# Patient Record
Sex: Female | Born: 1945 | Race: White | Hispanic: No | Marital: Married | Smoking: Never smoker
Health system: Southern US, Community
[De-identification: ages and names within clinical notes are randomized; demographics above are authoritative.]

## PROBLEM LIST (undated history)

## (undated) DIAGNOSIS — I10 Essential (primary) hypertension: Secondary | ICD-10-CM

## (undated) DIAGNOSIS — G4700 Insomnia, unspecified: Secondary | ICD-10-CM

## (undated) DIAGNOSIS — E119 Type 2 diabetes mellitus without complications: Secondary | ICD-10-CM

## (undated) DIAGNOSIS — E785 Hyperlipidemia, unspecified: Secondary | ICD-10-CM

## (undated) DIAGNOSIS — M109 Gout, unspecified: Secondary | ICD-10-CM

## (undated) DIAGNOSIS — M25511 Pain in right shoulder: Secondary | ICD-10-CM

## (undated) DIAGNOSIS — G4730 Sleep apnea, unspecified: Secondary | ICD-10-CM

## (undated) HISTORY — PX: ABDOMINAL HYSTERECTOMY: SHX81

## (undated) HISTORY — DX: Pain in right shoulder: M25.511

## (undated) HISTORY — DX: Sleep apnea, unspecified: G47.30

## (undated) HISTORY — DX: Essential (primary) hypertension: I10

## (undated) HISTORY — DX: Type 2 diabetes mellitus without complications: E11.9

## (undated) HISTORY — DX: Hyperlipidemia, unspecified: E78.5

## (undated) HISTORY — DX: Gout, unspecified: M10.9

## (undated) HISTORY — DX: Insomnia, unspecified: G47.00

---
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
## 2008-12-10 ENCOUNTER — Ambulatory Visit: Payer: Self-pay | Admitting: Internal Medicine

## 2013-06-06 ENCOUNTER — Encounter: Payer: Self-pay | Admitting: Podiatry

## 2013-06-13 ENCOUNTER — Encounter: Payer: Self-pay | Admitting: Podiatry

## 2013-06-13 ENCOUNTER — Ambulatory Visit (INDEPENDENT_AMBULATORY_CARE_PROVIDER_SITE_OTHER): Payer: Medicare HMO | Admitting: Podiatry

## 2013-06-13 ENCOUNTER — Ambulatory Visit (INDEPENDENT_AMBULATORY_CARE_PROVIDER_SITE_OTHER): Payer: Medicare HMO

## 2013-06-13 VITALS — BP 124/50 | HR 86 | Resp 16 | Ht 62.0 in | Wt 270.0 lb

## 2013-06-13 DIAGNOSIS — M79609 Pain in unspecified limb: Secondary | ICD-10-CM

## 2013-06-13 DIAGNOSIS — M7750 Other enthesopathy of unspecified foot: Secondary | ICD-10-CM

## 2013-06-13 DIAGNOSIS — B351 Tinea unguium: Secondary | ICD-10-CM

## 2013-06-13 DIAGNOSIS — M79673 Pain in unspecified foot: Secondary | ICD-10-CM

## 2013-06-13 MED ORDER — TRIAMCINOLONE ACETONIDE 10 MG/ML IJ SUSP
10.0000 mg | Freq: Once | INTRAMUSCULAR | Status: AC
  Administered 2013-06-13: 10 mg

## 2013-06-13 NOTE — Progress Notes (Signed)
Subjective:     Patient ID: Priscilla HarpsErnestine Johnson, female   DOB: 03/17/1946, 68 y.o.   MRN: [id number]  Foot Pain   patient presents stating I have pain on top of my left foot and I have nails I cannot cut myself and they become painful with a long-term history of diabetes states under reasonably good control   Review of Systems  All other systems reviewed and are negative.       Objective:   Physical Exam  Nursing note and vitals reviewed. Constitutional: She is oriented to person, place, and time.  Cardiovascular: Intact distal pulses.   Musculoskeletal: Normal range of motion.  Neurological: She is oriented to person, place, and time.  Skin: Skin is dry.   neurovascular status intact with no change in health history and normal range of motion of the subtalar midtarsal joint with mild equinus condition noted. Discomfort on the dorsum of the left foot around the tarsal joints with inflammation also of the tendon complex and he long gaited nailbeds 1-5 both feet that are thick and discomforting when pressed     Assessment:     Long-term diabetic with tendinitis probable arthritis and painful nails 1-5 both feet    Plan:     Reviewed conditions and x-rays and today did careful injection dorsal 3 mg Kenalog 5 mg Xylocaine Marcaine mixture and debridement nailbeds 1-5 both feet with no iatrogenic bleeding noted

## 2013-06-13 NOTE — Progress Notes (Signed)
   Subjective:    Patient ID: Priscilla Johnson, female    DOB: 12-21-1945, 68 y.o.   MRN: [id number]  HPI Comments: N 0 L left foot pain in great toe and dorsal / trim toenails D off and on 1 yr O sudden C better A walking T ibuprofen, went to [facility] seen dr Elvin Sopetery and dr price said she had arthritis and gave her a walking boot    Foot Pain      Review of Systems  HENT:       Sinus problems  Musculoskeletal:       Joint pain  All other systems reviewed and are negative.       Objective:   Physical Exam        Assessment & Plan:

## 2013-09-12 ENCOUNTER — Telehealth: Payer: Self-pay | Admitting: *Deleted

## 2013-09-12 ENCOUNTER — Ambulatory Visit: Payer: Medicare HMO | Admitting: Podiatry

## 2013-09-12 NOTE — Telephone Encounter (Signed)
SPOKE WITH SARA AT [facility]. TOLD HER THAT PT WOULD NEED TO CALL OUR OFFICE AND LET us KNOW WHICH DOCTOR IS TREATING HER FOR DIABETES AND WE WOULD SEND PAPERWORK OVER TO THAT PHYSICIAN SO HE/SHE CAN APPROVE DIABETIC SHOES. SARA UNDERSTOOD.

## 2013-09-19 ENCOUNTER — Ambulatory Visit (INDEPENDENT_AMBULATORY_CARE_PROVIDER_SITE_OTHER): Payer: Medicare HMO | Admitting: Podiatry

## 2013-09-19 VITALS — BP 125/60 | HR 87 | Resp 16

## 2013-09-19 DIAGNOSIS — M79609 Pain in unspecified limb: Secondary | ICD-10-CM

## 2013-09-19 DIAGNOSIS — B351 Tinea unguium: Secondary | ICD-10-CM

## 2013-09-20 NOTE — Progress Notes (Signed)
Subjective:     Patient ID: Priscilla Johnson, female   DOB: 01/06/1946, 68 y.o.   MRN: [id number]  HPI patient presents stating my nails are bad I cannot secure them reach them or see them and they get thick and become painful   Review of Systems     Objective:   Physical Exam Neurovascular status intact with thick nail bed 1-5 both feet there yellow brittle and impossible to cut with pain    Assessment:     Mycotic nail infection with pain 1-5 both feet    Plan:     Debridement painful nail bed 1-5 both feet with no iatrogenic bleeding noted

## 2013-12-19 ENCOUNTER — Ambulatory Visit: Payer: Medicare HMO | Admitting: Podiatry

## 2013-12-26 ENCOUNTER — Ambulatory Visit (INDEPENDENT_AMBULATORY_CARE_PROVIDER_SITE_OTHER): Payer: Medicare HMO | Admitting: Podiatry

## 2013-12-26 VITALS — BP 140/58 | HR 80 | Resp 16

## 2013-12-26 DIAGNOSIS — M7750 Other enthesopathy of unspecified foot: Secondary | ICD-10-CM

## 2013-12-26 DIAGNOSIS — M79609 Pain in unspecified limb: Secondary | ICD-10-CM

## 2013-12-26 DIAGNOSIS — B351 Tinea unguium: Secondary | ICD-10-CM

## 2013-12-26 DIAGNOSIS — M79673 Pain in unspecified foot: Secondary | ICD-10-CM

## 2013-12-26 NOTE — Progress Notes (Signed)
Subjective:     Patient ID: Priscilla Johnson, female   DOB: 1946-04-07, 69 y.o.   MRN: [id number]  HPI patient presents with nail disease and thickness 1-5 both feet that she cannot take care of and pain on top of the left foot that has really gotten bad over the last couple weeks   Review of Systems     Objective:   Physical Exam Neurovascular status unchanged with pain on top of the left foot that sore when pressed and nail disease with thickness and brittle type yellow debris 1-5 of both feet    Assessment:     Tendinitis dorsal left with mycotic nail infection 1-5 of both feet    Plan:     Injected dorsal foot 3 mg Kenalog 5 mg Xylocaine and debris did nailbeds 1-5 both feet with no iatrogenic bleeding noted

## 2014-03-23 ENCOUNTER — Other Ambulatory Visit: Payer: Medicare HMO

## 2014-03-30 ENCOUNTER — Ambulatory Visit (INDEPENDENT_AMBULATORY_CARE_PROVIDER_SITE_OTHER): Payer: Medicare HMO | Admitting: Podiatry

## 2014-03-30 DIAGNOSIS — B351 Tinea unguium: Secondary | ICD-10-CM

## 2014-03-30 DIAGNOSIS — M79673 Pain in unspecified foot: Secondary | ICD-10-CM

## 2014-03-30 NOTE — Progress Notes (Signed)
Subjective:     Patient ID: Priscilla Johnson, female   DOB: [birth year], 68 y.o.   MRN: [id number]  HPI patient presents stating my nails are bad I cannot secure them reach them or see them and they get thick and become painful   Review of Systems     Objective:   Physical Exam Neurovascular status intact with thick nail bed 1-5 both feet there yellow brittle and impossible to cut with pain    Assessment:     Mycotic nail infection with pain 1-5 both feet    Plan:     Debridement painful nail bed 1-5 both feet with no iatrogenic bleeding noted

## 2014-05-17 ENCOUNTER — Ambulatory Visit (INDEPENDENT_AMBULATORY_CARE_PROVIDER_SITE_OTHER): Payer: Medicare Other | Admitting: Podiatry

## 2014-05-17 VITALS — BP 118/60 | HR 84 | Resp 16

## 2014-05-17 DIAGNOSIS — M19072 Primary osteoarthritis, left ankle and foot: Secondary | ICD-10-CM | POA: Diagnosis not present

## 2014-05-17 DIAGNOSIS — M79673 Pain in unspecified foot: Secondary | ICD-10-CM | POA: Diagnosis not present

## 2014-05-22 NOTE — Progress Notes (Signed)
Patient ID: Priscilla Johnson, female   DOB: Apr 03, 1946, 69 y.o.   MRN: [id number]  Subjective: 69 year old female returns the office they with complaints of continued discomfort on the top of her left foot. She states that after last appointment she did have some resolution of symptoms after the injection into the area. Over the last week or so she has started to notice reoccurrence of symptoms. She is requesting a steroid injection to the area at this time. She denies any recent swelling or redness overlying the area. Denies any recent injury or trauma. No other complaints at this time in no acute changes since last appointment. Denies any systemic complaints such as fevers, chills, nausea, vomiting.  Objective: AAO 3, NAD DP/PT pulses palpable, CRT less than 3 seconds Pprotective sensation appears to be intact with Dorann OuSimms Weinstein monofilament There is diffuse tenderness upon palpation along the dorsal aspect left midfoot. There is a prone exostosis identified which is not palpable in the contralateral extremity. There is no overlying edema, erythema, increase in warmth. No specific area pinpoint bony tenderness and there is no pain with vibratory sensation. Decrease in medial arch height upon weightbearing. No other areas of tenderness to bilateral lower extremities. MMT 5/5, ROM WNL No pain with calf compression, swelling, warmth, erythema. No open lesions or pre-ulcerative lesions identified.  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 -Treatment options were discussed with the patient including alternatives, risks, complications. -At this time the patient is requesting a steroid injection into the area. Risks and, occasions of the injection were discussed for which she understands and verbally consents to the injection. Under sterile conditions a total of 1.5 mL of a mixture of dexamethasone phosphate and 0.5% Marcaine  plain was infiltrated into the area of maximal tenderness along the dorsal aspect in the midfoot. A Band-Aid was applied. Patient tolerated the injection well without complications. Post injection care was discussed the patient. -Discussed shoe gear modifications and orthotics to help support her foot type and help take pressure off the midfoot. -Follow-up as scheduled. In the meantime, encouraged to call the office with any questions, concerns, change in symptoms.

## 2014-06-29 ENCOUNTER — Ambulatory Visit: Payer: Medicare Other | Admitting: Podiatry

## 2014-07-10 ENCOUNTER — Ambulatory Visit (INDEPENDENT_AMBULATORY_CARE_PROVIDER_SITE_OTHER): Payer: Medicare Other

## 2014-07-10 ENCOUNTER — Ambulatory Visit (INDEPENDENT_AMBULATORY_CARE_PROVIDER_SITE_OTHER): Payer: Medicare Other | Admitting: Podiatry

## 2014-07-10 VITALS — BP 131/59 | HR 81 | Resp 16

## 2014-07-10 DIAGNOSIS — M10071 Idiopathic gout, right ankle and foot: Secondary | ICD-10-CM

## 2014-07-10 DIAGNOSIS — M79609 Pain in unspecified limb: Secondary | ICD-10-CM | POA: Diagnosis not present

## 2014-07-10 DIAGNOSIS — M79671 Pain in right foot: Secondary | ICD-10-CM | POA: Diagnosis not present

## 2014-07-10 DIAGNOSIS — M1000 Idiopathic gout, unspecified site: Secondary | ICD-10-CM | POA: Diagnosis not present

## 2014-07-10 DIAGNOSIS — M109 Gout, unspecified: Secondary | ICD-10-CM

## 2014-07-11 ENCOUNTER — Other Ambulatory Visit: Payer: Self-pay | Admitting: Podiatry

## 2014-07-11 LAB — CBC WITH DIFFERENTIAL/PLATELET
BASOS: 0 %
Basophils Absolute: 0 10*3/uL (ref 0.0–0.2)
EOS ABS: 0 10*3/uL (ref 0.0–0.4)
Eos: 0 %
HEMATOCRIT: 34.6 % (ref 34.0–46.6)
HEMOGLOBIN: 10.7 g/dL — AB (ref 11.1–15.9)
Immature Grans (Abs): 0 10*3/uL (ref 0.0–0.1)
Immature Granulocytes: 0 %
Lymphocytes Absolute: 1.5 10*3/uL (ref 0.7–3.1)
Lymphs: 16 %
MCH: 26.8 pg (ref 26.6–33.0)
MCHC: 30.9 g/dL — AB (ref 31.5–35.7)
MCV: 87 fL (ref 79–97)
MONOS ABS: 0.8 10*3/uL (ref 0.1–0.9)
Monocytes: 8 %
Neutrophils Absolute: 7.1 10*3/uL — ABNORMAL HIGH (ref 1.4–7.0)
Neutrophils Relative %: 76 %
Platelets: 302 10*3/uL (ref 150–379)
RBC: 4 x10E6/uL (ref 3.77–5.28)
RDW: 15.6 % — AB (ref 12.3–15.4)
WBC: 9.6 10*3/uL (ref 3.4–10.8)

## 2014-07-11 LAB — C-REACTIVE PROTEIN: CRP: 127 mg/L — AB (ref 0.0–4.9)

## 2014-07-11 LAB — URIC ACID: Uric Acid: 10.4 mg/dL — ABNORMAL HIGH (ref 2.5–7.1)

## 2014-07-11 LAB — SEDIMENTATION RATE: SED RATE: 42 mm/h — AB (ref 0–40)

## 2014-07-11 MED ORDER — METHYLPREDNISOLONE (PAK) 4 MG PO TABS: ORAL_TABLET | ORAL | Status: DC

## 2014-07-11 NOTE — Progress Notes (Signed)
Patient ID: Versa Craton, female   DOB: [birth year], 69 y.o.   MRN: [id number]  Subjective: 69 year old female returns the office today with complaints of right big toe pain, swelling, redness which started on Friday. She states that over the last couple days she has noticed increasing pain and swelling. She denies any history of injury or trauma to the area. She denies any recent changes in her diet and she denies ever having a history of gout. She states that she has pain when trying to bend the toe and upon prolonged weightbearing. She states that her left foot is doing well and had no problems since the injection. She denies any systemic complaints as fevers, chills, nausea, vomiting. No other complaints at this time.  Objective: AAO x3, NAD DP/PT pulses palpable bilaterally, CRT less than 3 seconds Protective sensation intact with Simms Weinstein monofilament, vibratory sensation intact, Achilles tendon reflex intact Tenderness palpation overlying the right first MTPJ mostly plantarly along the sesamoids. There is no tenderness directly upon the medial and lateral sesamoid. There is mild discomfort with first MPJ range of motion. There is mild overlying edema, erythema without much associated warmth. There is no ascending cellulitis. There is no areas of fluctuance or crepitus. No open lesions. There is no specific tenderness overlying the first metatarsal hallux. No other areas of tenderness to bilateral lower extremities. MMT 5/5, ROM WNL.  No open lesions or pre-ulcerative lesions.  No overlying edema, erythema, increase in warmth to bilateral lower extremities.  No pain with calf compression, swelling, warmth, erythema bilaterally.  Assessment: 69 year old female right first MPJ pain; possible gout/sesamoiditis.  Plan: -X-rays were obtained and reviewed with the patient. -Treatment options were discussed including alternatives, risks, complications. -At this time discussed possible  etiologies of the pain with the patient. -Discussed steroid injection into the area which can help both gout or sesamoiditis. Patient would like to proceed with this after understanding risks complications and verbally consented. Under sterile conditions a total of 1.5 mL of a mixture of dexamethasone phosphate and 0.5% Marcaine plain was infiltrated around the sesamoids and the first MPJ without any complications. Post injection care was discussed the patient. -Offloading pads were dispensed to the patient. -Ordered CBC, ESR, CRP, uric acid -Follow-up in 2 weeks or sooner if any problems are to arise. In the meantime encouraged to call the office with any questions, concerns, change in symptoms.  *On March 23 of received the results of the blood work which revealed an elevated uric acid. I prescribed a Medrol Dosepak.

## 2014-07-16 DIAGNOSIS — E1129 Type 2 diabetes mellitus with other diabetic kidney complication: Secondary | ICD-10-CM | POA: Diagnosis not present

## 2014-07-16 DIAGNOSIS — I10 Essential (primary) hypertension: Secondary | ICD-10-CM | POA: Diagnosis not present

## 2014-07-16 DIAGNOSIS — I129 Hypertensive chronic kidney disease with stage 1 through stage 4 chronic kidney disease, or unspecified chronic kidney disease: Secondary | ICD-10-CM | POA: Diagnosis not present

## 2014-07-16 DIAGNOSIS — E785 Hyperlipidemia, unspecified: Secondary | ICD-10-CM | POA: Diagnosis not present

## 2014-07-20 ENCOUNTER — Ambulatory Visit: Payer: Medicare Other

## 2014-07-24 ENCOUNTER — Ambulatory Visit (INDEPENDENT_AMBULATORY_CARE_PROVIDER_SITE_OTHER): Payer: Medicare Other | Admitting: Podiatry

## 2014-07-24 ENCOUNTER — Encounter: Payer: Self-pay | Admitting: Podiatry

## 2014-07-24 VITALS — BP 118/53 | HR 76 | Resp 18 | Ht 63.0 in | Wt 275.0 lb

## 2014-07-24 DIAGNOSIS — M109 Gout, unspecified: Secondary | ICD-10-CM

## 2014-07-24 DIAGNOSIS — M10071 Idiopathic gout, right ankle and foot: Secondary | ICD-10-CM

## 2014-07-24 NOTE — Patient Instructions (Signed)
Gout Gout is an inflammatory arthritis caused by a buildup of uric acid crystals in the joints. Uric acid is a chemical that is normally present in the blood. When the level of uric acid in the blood is too high it can form crystals that deposit in your joints and tissues. This causes joint redness, soreness, and swelling (inflammation). Repeat attacks are common. Over time, uric acid crystals can form into masses (tophi) near a joint, destroying bone and causing disfigurement. Gout is treatable and often preventable. CAUSES  The disease begins with elevated levels of uric acid in the blood. Uric acid is produced by your body when it breaks down a naturally found substance called purines. Certain foods you eat, such as meats and fish, contain high amounts of purines. Causes of an elevated uric acid level include:  Being passed down from parent to child (heredity).  Diseases that cause increased uric acid production (such as obesity, psoriasis, and certain cancers).  Excessive alcohol use.  Diet, especially diets rich in meat and seafood.  Medicines, including certain cancer-fighting medicines (chemotherapy), water pills (diuretics), and aspirin.  Chronic kidney disease. The kidneys are no longer able to remove uric acid well.  Problems with metabolism. Conditions strongly associated with gout include:  Obesity.  High blood pressure.  High cholesterol.  Diabetes. Not everyone with elevated uric acid levels gets gout. It is not understood why some people get gout and others do not. Surgery, joint injury, and eating too much of certain foods are some of the factors that can lead to gout attacks. SYMPTOMS   An attack of gout comes on quickly. It causes intense pain with redness, swelling, and warmth in a joint.  Fever can occur.  Often, only one joint is involved. Certain joints are more commonly involved:  Base of the big toe.  Knee.  Ankle.  Wrist.  Finger. Without  treatment, an attack usually goes away in a few days to weeks. Between attacks, you usually will not have symptoms, which is different from many other forms of arthritis. DIAGNOSIS  Your caregiver will suspect gout based on your symptoms and exam. In some cases, tests may be recommended. The tests may include:  Blood tests.  Urine tests.  X-rays.  Joint fluid exam. This exam requires a needle to remove fluid from the joint (arthrocentesis). Using a microscope, gout is confirmed when uric acid crystals are seen in the joint fluid. TREATMENT  There are two phases to gout treatment: treating the sudden onset (acute) attack and preventing attacks (prophylaxis).  Treatment of an Acute Attack.  Medicines are used. These include anti-inflammatory medicines or steroid medicines.  An injection of steroid medicine into the affected joint is sometimes necessary.  The painful joint is rested. Movement can worsen the arthritis.  You may use warm or cold treatments on painful joints, depending which works best for you.  Treatment to Prevent Attacks.  If you suffer from frequent gout attacks, your caregiver may advise preventive medicine. These medicines are started after the acute attack subsides. These medicines either help your kidneys eliminate uric acid from your body or decrease your uric acid production. You may need to stay on these medicines for a very long time.  The early phase of treatment with preventive medicine can be associated with an increase in acute gout attacks. For this reason, during the first few months of treatment, your caregiver may also advise you to take medicines usually used for acute gout treatment. Be sure you   understand your caregiver's directions. Your caregiver may make several adjustments to your medicine dose before these medicines are effective.  Discuss dietary treatment with your caregiver or dietitian. Alcohol and drinks high in sugar and fructose and foods  such as meat, poultry, and seafood can increase uric acid levels. Your caregiver or dietitian can advise you on drinks and foods that should be limited. HOME CARE INSTRUCTIONS   Do not take aspirin to relieve pain. This raises uric acid levels.  Only take over-the-counter or prescription medicines for pain, discomfort, or fever as directed by your caregiver.  Rest the joint as much as possible. When in bed, keep sheets and blankets off painful areas.  Keep the affected joint raised (elevated).  Apply warm or cold treatments to painful joints. Use of warm or cold treatments depends on which works best for you.  Use crutches if the painful joint is in your leg.  Drink enough fluids to keep your urine clear or pale yellow. This helps your body get rid of uric acid. Limit alcohol, sugary drinks, and fructose drinks.  Follow your dietary instructions. Pay careful attention to the amount of protein you eat. Your daily diet should emphasize fruits, vegetables, whole grains, and fat-free or low-fat milk products. Discuss the use of coffee, vitamin C, and cherries with your caregiver or dietitian. These may be helpful in lowering uric acid levels.  Maintain a healthy body weight. SEEK MEDICAL CARE IF:   You develop diarrhea, vomiting, or any side effects from medicines.  You do not feel better in 24 hours, or you are getting worse. SEEK IMMEDIATE MEDICAL CARE IF:   Your joint becomes suddenly more tender, and you have chills or a fever. MAKE SURE YOU:   Understand these instructions.  Will watch your condition.  Will get help right away if you are not doing well or get worse. Document Released: 04/03/2000 Document Revised: 08/21/2013 Document Reviewed: 11/18/2011 [facility] Patient Information 2015 [employer], [place]. This information is not intended to replace advice given to you by your health care provider. Make sure you discuss any questions you have with your health care provider.  n you  eat. Include fruits, vegetables, whole grains, and fat-free or low-fat milk products in your daily diet. Talk to your doctor or dietitian about the use of coffee, vitamin C, and cherries. These may help lower uric acid levels.  Keep a healthy body weight. GET HELP RIGHT AWAY IF:   You have watery poop (diarrhea), throw up (vomit), or have any side effects from medicines.  You do not feel better in 24 hours, or you are getting worse.  Your joint becomes suddenly more tender, and you have chills or a fever. MAKE SURE YOU:   Understand these instructions.  Will watch your condition.  Will get help right away if you are not doing well or get worse. Document Released: 01/14/2008 Document Revised: 08/21/2013 Document Reviewed: 11/18/2011 [facility]Exit[facility] Patient Information 2015 [place], [place]. This information is not intended to replace advice given to you by your health care provider. Make sure you discuss any questions you have with your health care provider.

## 2014-07-25 NOTE — Progress Notes (Signed)
Patient ID: Priscilla Johnson, female   DOB: Dec 05, 1945, 69 y.o.   MRN: [id number]  Subjective: 69 year old female returns the office they for follow-up evaluation of right first metatarsal-phalangeal joint pain. She states that since starting the Medrol Dosepak for which she finish she's had resolution of symptoms and she no longer has any discomfort, swelling, redness. She stated that she is doing well this time. No other complaints at this time no acute changes.  Objective: AAO 3, NAD DP/PT pulses palpable, CRT less than 3 seconds  Protective sensation intact with Simms Weinstein monofilament, vibratory sensation intact, Achilles tendon reflex intact. Currently, there is no tenderness to palpation overlying the right first metatarsal phalangeal joint or along the sesamoids. There is no areas of pinpoint bony tenderness or pain with vibratory sensation of bilateral lower extremity. There is no upon edema, erythema, increased warmth bilaterally. MMT 5/5, ROM WNL No open lesions or pre-ulcer lesions identified bilaterally. No pain with calf compression, swelling, warmth, erythema.  Assessment: 69 year old female with resolved gout right foot  Plan: -I discussed the patient likely etiology for symptoms and gout. I discussed the patient had a regular diet to help prevent recurrence of gout attacks. Also discussed that if this is a continual problem would likely benefit from long-term therapy. For now continue to monitor. Follow-up as needed.

## 2014-07-27 ENCOUNTER — Ambulatory Visit: Payer: Medicare Other

## 2014-08-17 ENCOUNTER — Ambulatory Visit: Payer: Self-pay

## 2014-08-21 ENCOUNTER — Ambulatory Visit: Payer: Self-pay

## 2014-08-28 ENCOUNTER — Ambulatory Visit (INDEPENDENT_AMBULATORY_CARE_PROVIDER_SITE_OTHER): Payer: Medicare Other | Admitting: Podiatry

## 2014-08-28 DIAGNOSIS — M79673 Pain in unspecified foot: Secondary | ICD-10-CM

## 2014-08-28 DIAGNOSIS — B351 Tinea unguium: Secondary | ICD-10-CM | POA: Diagnosis not present

## 2014-08-28 NOTE — Progress Notes (Signed)
  Subjective: 69 y.o.-year-old female returns the office today for painful, elongated, thickened toenails. Denies any redness or drainage around the nails. She states the gout symptoms have resolved and she has nad no reoccurrence. Denies any acute changes since last appointment and no new complaints today. Denies any systemic complaints such as fevers, chills, nausea, vomiting.   Objective: AAO 3, NAD DP/PT pulses palpable, CRT less than 3 seconds Protective sensation intact with Simms Weinstein monofilament, Achilles tendon reflex intact.  Nails hypertrophic, dystrophic, elongated, brittle, discolored 10. There is tenderness overlying these nails. There is no surrounding erythema or drainage along the nail sites. No open lesions or pre-ulcerative lesions are identified. No other areas of tenderness bilateral lower extremities. No overlying edema, erythema, increased warmth. No pain with calf compression, swelling, warmth, erythema.  Assessment: Patient presents with symptomatic onychomycosis  Plan: -Treatment options including alternatives, risks, complications were discussed -Nails sharply debrided 10 without complication/bleeding. -Discussed daily foot inspection. If there are any changes, to call the office immediately.  -Follow-up in 3 months or sooner if any problems are to arise. In the meantime, encouraged to call the office with any questions, concerns, changes symptoms.

## 2014-10-13 ENCOUNTER — Other Ambulatory Visit: Payer: Self-pay | Admitting: Unknown Physician Specialty

## 2014-11-28 ENCOUNTER — Ambulatory Visit: Payer: Medicare Other

## 2014-12-11 ENCOUNTER — Ambulatory Visit (INDEPENDENT_AMBULATORY_CARE_PROVIDER_SITE_OTHER): Payer: Medicare Other | Admitting: Podiatry

## 2014-12-11 DIAGNOSIS — M79673 Pain in unspecified foot: Secondary | ICD-10-CM | POA: Diagnosis not present

## 2014-12-11 DIAGNOSIS — B351 Tinea unguium: Secondary | ICD-10-CM

## 2014-12-11 NOTE — Progress Notes (Signed)
  Subjective: 69 y.o.-year-old female returns the office today for painful, elongated, thickened toenails. Denies any redness or drainage around the nails. Denies any acute changes since last appointment and no new complaints today. Denies any systemic complaints such as fevers, chills, nausea, vomiting.  She tested that she's had a few gout flare since last appointment however she did not see a physician for this. Currently denies any symptoms.   Objective: AAO 3, NAD DP/PT pulses palpable, CRT less than 3 seconds Nails hypertrophic, dystrophic, elongated, brittle, discolored 10. There is tenderness overlying the nails 1-5 bilaterally. There is no surrounding erythema or drainage along the nail sites. No open lesions or pre-ulcerative lesions are identified. No other areas of tenderness bilateral lower extremities. No overlying edema, erythema, increased warmth. No pain with calf compression, swelling, warmth, erythema.  Assessment: Patient presents with symptomatic onychomycosis  Plan: -Treatment options including alternatives, risks, complications were discussed -Nails sharply debrided 10 without complication/bleeding. -Discussed daily foot inspection. If there are any changes, to call the office immediately.  -Monitor for gout flares. Discussed she would likely benefit from long term treatment. She would like to hold off for now. -Follow-up in 3 months or sooner if any problems are to arise. In the meantime, encouraged to call the office with any questions, concerns, changes symptoms.   Ovid Curd, DPM

## 2015-01-16 ENCOUNTER — Telehealth: Payer: Self-pay | Admitting: Unknown Physician Specialty

## 2015-01-16 ENCOUNTER — Ambulatory Visit (INDEPENDENT_AMBULATORY_CARE_PROVIDER_SITE_OTHER): Payer: Medicare Other | Admitting: Unknown Physician Specialty

## 2015-01-16 ENCOUNTER — Encounter: Payer: Self-pay | Admitting: Unknown Physician Specialty

## 2015-01-16 VITALS — BP 140/78 | HR 74 | Temp 98.3°F | Ht 62.5 in | Wt 280.4 lb

## 2015-01-16 DIAGNOSIS — Z23 Encounter for immunization: Secondary | ICD-10-CM

## 2015-01-16 DIAGNOSIS — E785 Hyperlipidemia, unspecified: Secondary | ICD-10-CM | POA: Diagnosis not present

## 2015-01-16 DIAGNOSIS — N183 Chronic kidney disease, stage 3 unspecified: Secondary | ICD-10-CM

## 2015-01-16 DIAGNOSIS — N185 Chronic kidney disease, stage 5: Secondary | ICD-10-CM | POA: Diagnosis not present

## 2015-01-16 DIAGNOSIS — I129 Hypertensive chronic kidney disease with stage 1 through stage 4 chronic kidney disease, or unspecified chronic kidney disease: Secondary | ICD-10-CM | POA: Diagnosis not present

## 2015-01-16 DIAGNOSIS — N189 Chronic kidney disease, unspecified: Secondary | ICD-10-CM | POA: Diagnosis not present

## 2015-01-16 DIAGNOSIS — E119 Type 2 diabetes mellitus without complications: Secondary | ICD-10-CM | POA: Diagnosis not present

## 2015-01-16 DIAGNOSIS — N184 Chronic kidney disease, stage 4 (severe): Secondary | ICD-10-CM

## 2015-01-16 DIAGNOSIS — N182 Chronic kidney disease, stage 2 (mild): Secondary | ICD-10-CM

## 2015-01-16 DIAGNOSIS — N181 Chronic kidney disease, stage 1: Secondary | ICD-10-CM

## 2015-01-16 DIAGNOSIS — E1122 Type 2 diabetes mellitus with diabetic chronic kidney disease: Secondary | ICD-10-CM | POA: Diagnosis not present

## 2015-01-16 DIAGNOSIS — G4700 Insomnia, unspecified: Secondary | ICD-10-CM

## 2015-01-16 DIAGNOSIS — E1129 Type 2 diabetes mellitus with other diabetic kidney complication: Secondary | ICD-10-CM | POA: Insufficient documentation

## 2015-01-16 DIAGNOSIS — G4730 Sleep apnea, unspecified: Secondary | ICD-10-CM | POA: Insufficient documentation

## 2015-01-16 LAB — LIPID PANEL PICCOLO, WAIVED
CHOL/HDL RATIO PICCOLO,WAIVE: 2.9 mg/dL
Cholesterol Piccolo, Waived: 152 mg/dL (ref <200)
HDL Chol Piccolo, Waived: 52 mg/dL — ABNORMAL LOW (ref >59)
LDL CHOL CALC PICCOLO WAIVED: 75 mg/dL (ref <100)
TRIGLYCERIDES PICCOLO,WAIVED: 124 mg/dL (ref <150)
VLDL CHOL CALC PICCOLO,WAIVE: 25 mg/dL (ref <30)

## 2015-01-16 LAB — BAYER DCA HB A1C WAIVED: HB A1C: 6.3 % (ref <7.0)

## 2015-01-16 MED ORDER — LISINOPRIL-HYDROCHLOROTHIAZIDE 20-25 MG PO TABS: 1.0000 | ORAL_TABLET | Freq: Every day | ORAL | Status: DC

## 2015-01-16 MED ORDER — PIOGLITAZONE HCL 30 MG PO TABS: 30.0000 mg | ORAL_TABLET | Freq: Every day | ORAL | Status: DC

## 2015-01-16 MED ORDER — ATORVASTATIN CALCIUM 40 MG PO TABS: 40.0000 mg | ORAL_TABLET | Freq: Every day | ORAL | Status: DC

## 2015-01-16 MED ORDER — LIRAGLUTIDE 18 MG/3ML ~~LOC~~ SOPN: 1.8000 mL | PEN_INJECTOR | Freq: Every day | SUBCUTANEOUS | Status: DC

## 2015-01-16 MED ORDER — AMLODIPINE BESYLATE 10 MG PO TABS
10.0000 mg | ORAL_TABLET | Freq: Every day | ORAL | Status: DC
Start: 2015-01-16 — End: 2015-07-15

## 2015-01-16 MED ORDER — GLIPIZIDE 5 MG PO TABS: 5.0000 mg | ORAL_TABLET | Freq: Two times a day (BID) | ORAL | Status: DC

## 2015-01-16 MED ORDER — METFORMIN HCL 500 MG PO TABS: 500.0000 mg | ORAL_TABLET | Freq: Two times a day (BID) | ORAL | Status: DC

## 2015-01-16 MED ORDER — ATENOLOL 100 MG PO TABS: 100.0000 mg | ORAL_TABLET | Freq: Every day | ORAL | Status: DC

## 2015-01-16 NOTE — Telephone Encounter (Signed)
Disregard

## 2015-01-16 NOTE — Progress Notes (Signed)
BP 140/78 mmHg  Pulse 74  Temp(Src) 98.3 F (36.8 C)  Ht 5' 2.5" (1.588 m)  Wt 280 lb 6.4 oz (127.189 kg)  BMI 50.44 kg/m2  SpO2 92%  LMP  (LMP Unknown)   Subjective:    Patient ID: Priscilla Johnson, female    DOB: 1946/03/31, 69 y.o.   MRN: [id number]  HPI: Priscilla Johnson is a 69 y.o. female  Chief Complaint  Patient presents with  . Diabetes  . Hyperlipidemia  . Hypertension   1.  DIABETES Hypoglycemic episodes:  no  H8  Polydipsia/polyuria:  no  H8 Visual disturbance:  no  R2  Chest pain:  no  R4  Paresthesias:  no  R10  Glucose Monitoring:  yes   Accucheck frequency:  occasional Fasting glucose:  113 just recently  HGB A1C: 6.4 6 months ago D1  GLUCOSE: 151 Foot Exam: Foot Exam Done on 01/24/13 P1 Aspirin:  Aspirin Therapy Done on 10/02/13 (Taking aspirin  )   2.  HYPERTENSION / HYPERLIPIDEMIA Patient is requesting refill(s). Satisfied with current treatment?  yes  H6  BP monitoring frequency:  not checking.  H5  BP medication side effects:  no P1 Cholesterol medication side effects:  no P1  Aspirin:  Aspirin Therapy Done on 10/02/13 (Taking aspirin  ) Recent stressors:  no  H6   Recurrent headaches:  no  R10 Visual changes:  no  R2  Palpitations:  no  R4  Dyspnea:  no  R5  Chest pain:  no  R4  Lower extremity edema:  no  R4 R foot, possible gout and arthritis in both feet Dizzy/lightheaded:  no  R10   3. Gout Went to the foot doctor who diagnosed her with gout.  She started no medications at this time.    Relevant past medical, surgical, family and social history reviewed and updated as indicated. Interim medical history since our last visit reviewed. Allergies and medications reviewed and updated.  Review of Systems  Constitutional: Negative.   HENT: Negative.   Eyes: Negative.   Respiratory: Negative.   Cardiovascular: Negative.   Gastrointestinal: Negative.   Endocrine: Negative.   Genitourinary: Negative.   Musculoskeletal:  Negative.   Skin: Negative.   Allergic/Immunologic: Negative.   Neurological: Negative.   Hematological: Negative.   Psychiatric/Behavioral: Negative.     Per HPI unless specifically indicated above     Objective:    BP 140/78 mmHg  Pulse 74  Temp(Src) 98.3 F (36.8 C)  Ht 5' 2.5" (1.588 m)  Wt 280 lb 6.4 oz (127.189 kg)  BMI 50.44 kg/m2  SpO2 92%  LMP  (LMP Unknown)  Wt Readings from Last 3 Encounters:  01/16/15 280 lb 6.4 oz (127.189 kg)  07/16/14 274 lb (124.286 kg)  07/24/14 275 lb (124.739 kg)    Physical Exam  Constitutional: She is oriented to person, place, and time. She appears well-developed and well-nourished. No distress.  HENT:  Head: Normocephalic and atraumatic.  Eyes: Conjunctivae and lids are normal. Right eye exhibits no discharge. Left eye exhibits no discharge. No scleral icterus.  Cardiovascular: Normal rate, regular rhythm and normal heart sounds.   Pulmonary/Chest: Effort normal and breath sounds normal. No respiratory distress.  Abdominal: Normal appearance and bowel sounds are normal. There is no splenomegaly or hepatomegaly.  Musculoskeletal: Normal range of motion.  Neurological: She is alert and oriented to person, place, and time.  Skin: Skin is intact. No rash noted. No pallor.  Psychiatric: She has a normal  mood and affect. Her behavior is normal. Judgment and thought content normal.   .     Assessment & Plan:   Problem List Items Addressed This Visit      Unprioritized   Type II diabetes mellitus with renal manifestations    Hgb A1C is 6.3.  Continue present meds      Relevant Medications   metFORMIN (GLUCOPHAGE) 500 MG tablet   lisinopril-hydrochlorothiazide (PRINZIDE,ZESTORETIC) 20-25 MG tablet   Liraglutide (VICTOZA) 18 MG/3ML SOPN   glipiZIDE (GLUCOTROL) 5 MG tablet   atorvastatin (LIPITOR) 40 MG tablet   pioglitazone (ACTOS) 30 MG tablet   Other Relevant Orders   Bayer DCA Hb A1c Waived   Hypertensive CKD (chronic  kidney disease)    Stable, continue present medications.       CKD (chronic kidney disease), stage III   Relevant Orders   Comprehensive metabolic panel   Hyperlipidemia    Lipid panel reviewed.  Stable, continue present meds      Relevant Medications   lisinopril-hydrochlorothiazide (PRINZIDE,ZESTORETIC) 20-25 MG tablet   atorvastatin (LIPITOR) 40 MG tablet   atenolol (TENORMIN) 100 MG tablet   amLODipine (NORVASC) 10 MG tablet   Other Relevant Orders   Lipid Panel Piccolo, Waived    Other Visit Diagnoses    Immunization due    -  Primary    Relevant Orders    Flu Vaccine QUAD 36+ mos PF IM (Fluarix & Fluzone Quad PF) (Completed)        Follow up plan: Return for physical.

## 2015-01-16 NOTE — Assessment & Plan Note (Signed)
Lipid panel reviewed.  Stable, continue present meds

## 2015-01-16 NOTE — Assessment & Plan Note (Signed)
Stable, continue present medications.   

## 2015-01-16 NOTE — Assessment & Plan Note (Signed)
Hgb A1C is 6.3.  Continue present meds 

## 2015-01-17 LAB — COMPREHENSIVE METABOLIC PANEL
ALBUMIN: 3.5 g/dL (ref 3.5–4.8)
ALK PHOS: 120 IU/L — AB (ref 39–117)
ALT: 8 IU/L (ref 0–32)
AST: 11 IU/L (ref 0–40)
Albumin/Globulin Ratio: 1.5 (ref 1.1–2.5)
BUN / CREAT RATIO: 20 (ref 11–26)
BUN: 18 mg/dL (ref 8–27)
Bilirubin Total: 0.3 mg/dL (ref 0.0–1.2)
CO2: 25 mmol/L (ref 18–29)
CREATININE: 0.92 mg/dL (ref 0.57–1.00)
Calcium: 9.1 mg/dL (ref 8.7–10.3)
Chloride: 97 mmol/L (ref 97–108)
GFR, EST AFRICAN AMERICAN: 73 mL/min/{1.73_m2} (ref >59)
GFR, EST NON AFRICAN AMERICAN: 63 mL/min/{1.73_m2} (ref >59)
GLOBULIN, TOTAL: 2.4 g/dL (ref 1.5–4.5)
GLUCOSE: 81 mg/dL (ref 65–99)
Potassium: 4.2 mmol/L (ref 3.5–5.2)
SODIUM: 139 mmol/L (ref 134–144)
TOTAL PROTEIN: 5.9 g/dL — AB (ref 6.0–8.5)

## 2015-03-12 ENCOUNTER — Ambulatory Visit (INDEPENDENT_AMBULATORY_CARE_PROVIDER_SITE_OTHER): Payer: Medicare Other | Admitting: Sports Medicine

## 2015-03-12 ENCOUNTER — Ambulatory Visit: Payer: Medicare Other

## 2015-03-12 ENCOUNTER — Encounter: Payer: Self-pay | Admitting: Sports Medicine

## 2015-03-12 DIAGNOSIS — L309 Dermatitis, unspecified: Secondary | ICD-10-CM | POA: Diagnosis not present

## 2015-03-12 DIAGNOSIS — M79673 Pain in unspecified foot: Secondary | ICD-10-CM | POA: Diagnosis not present

## 2015-03-12 DIAGNOSIS — E119 Type 2 diabetes mellitus without complications: Secondary | ICD-10-CM

## 2015-03-12 DIAGNOSIS — B351 Tinea unguium: Secondary | ICD-10-CM

## 2015-03-12 MED ORDER — CLOTRIMAZOLE-BETAMETHASONE 1-0.05 % EX LOTN: TOPICAL_LOTION | Freq: Two times a day (BID) | CUTANEOUS | Status: DC

## 2015-03-12 NOTE — Progress Notes (Signed)
Patient ID: Priscilla Johnson, female   DOB: [birth year], 69 y.o.   MRN: [id number] Subjective: Priscilla Johnson is a 69 y.o. female patient with history of type 2 diabetes who presents to office today complaining of long, painful nails  while ambulating in shoes; unable to trim. Patient states that the glucose reading this morning was in good range. Patient is also concerned about itchy rash that is on her legs; states that she had it treated before in past by dermatologist and it went away but now it is back and is itchy; denies any other problems or concerns. Patient denies any new changes in medications. Patient denies any new cramping, numbness, burning or tingling in the legs.  Patient Active Problem List   Diagnosis Date Noted  . Insomnia 01/16/2015  . Sleep apnea 01/16/2015  . Type II diabetes mellitus with renal manifestations (HCC) 01/16/2015  . Hypertensive CKD (chronic kidney disease) 01/16/2015  . Severe obesity (HCC) 01/16/2015  . CKD (chronic kidney disease), stage III 01/16/2015  . Hyperlipidemia 01/16/2015   Current Outpatient Prescriptions on File Prior to Visit  Medication Sig Dispense Refill  . amLODipine (NORVASC) 10 MG tablet Take 1 tablet (10 mg total) by mouth daily. 90 tablet 1  . aspirin 81 MG tablet Take 81 mg by mouth daily.    Marland Kitchen atenolol (TENORMIN) 100 MG tablet Take 1 tablet (100 mg total) by mouth daily. 90 tablet 1  . atorvastatin (LIPITOR) 40 MG tablet Take 1 tablet (40 mg total) by mouth daily. 90 tablet 1  . glipiZIDE (GLUCOTROL) 5 MG tablet Take 1 tablet (5 mg total) by mouth 2 (two) times daily before a meal. 180 tablet 1  . Insulin Pen Needle (PENTIPS) 32G X 4 MM MISC by Does not apply route.    . Liraglutide (VICTOZA) 18 MG/3ML SOPN Inject 1.8 mLs (10.8 mg total) into the skin daily. 3 pen 12  . lisinopril-hydrochlorothiazide (PRINZIDE,ZESTORETIC) 20-25 MG tablet Take 1 tablet by mouth daily. 90 tablet 1  . meloxicam (MOBIC) 7.5 MG tablet Take 7.5 mg by  mouth 2 (two) times daily.    . metFORMIN (GLUCOPHAGE) 500 MG tablet Take 1 tablet (500 mg total) by mouth 2 (two) times daily. 180 tablet 1  . pioglitazone (ACTOS) 30 MG tablet Take 1 tablet (30 mg total) by mouth daily. 90 tablet 1   No current facility-administered medications on file prior to visit.   No Known Allergies  Labs: HEMOGLOBIN A1C- 6.3  Objective: General: Patient is awake, alert, and oriented x 3 and in no acute distress.  Integument: Skin is warm, dry and supple bilateral. Nails are tender, long, thickened and  dystrophic with subungual debris, consistent with onychomycosis, 1-5 bilateral. There is a scaly, pruritic rash to anterior shins with small red spots that appears to be consistent with dermatitis with no signs of infection bilateral. No open lesions or preulcerative lesions present bilateral. Remaining integument unremarkable.  Vasculature:  Dorsalis Pedis pulse 1/4 bilateral. Posterior Tibial pulse  1/4 bilateral.  Capillary fill time <3 sec 1-5 bilateral. Scant hair growth to the level of the digits. Temperature gradient within normal limits. No varicosities present bilateral. No edema present bilateral.   Neurology: The patient has intact sensation measured with a 5.07/10g Semmes Weinstein Monofilament at all pedal sites bilateral . Vibratory sensation intact bilateral with tuning fork. No Babinski sign present bilateral.   Musculoskeletal: No gross pedal deformities noted bilateral. Muscular strength 5/5 in all lower extremity muscular groups bilateral without pain or  limitation on range of motion . No tenderness with calf compression bilateral.  Assessment and Plan: Problem List Items Addressed This Visit    None    Visit Diagnoses    Dermatophytosis of nail    -  Primary    Relevant Medications    clotrimazole-betamethasone (LOTRISONE) lotion    Foot pain, unspecified laterality        Dermatitis        bilateral lower extremities     Relevant  Medications    clotrimazole-betamethasone (LOTRISONE) lotion    Diabetes mellitus without complication (HCC)           -Examined patient. -Discussed and educated patient on diabetic foot care, especially with  regards to the vascular, neurological and musculoskeletal systems.  -Stressed the importance of good glycemic control and the detriment of not  controlling glucose levels in relation to the foot. -Mechanically debrided all nails 1-5 bilateral using sterile nail nipper and filed with dremel without incident  -Rx Lotrisone cream for rash on legs to use daily -Answered all patient questions -Patient to return  in 3 months for at risk foot care. Cont with good supportive shoes daily.  -Patient advised to call the office if any problems or questions arise in the  Meantime.  Asencion Islamitorya [place], DPM

## 2015-03-29 ENCOUNTER — Encounter: Payer: Medicare Other | Admitting: Unknown Physician Specialty

## 2015-04-18 ENCOUNTER — Encounter: Payer: Self-pay | Admitting: Unknown Physician Specialty

## 2015-04-18 ENCOUNTER — Ambulatory Visit (INDEPENDENT_AMBULATORY_CARE_PROVIDER_SITE_OTHER): Payer: Medicare Other | Admitting: Unknown Physician Specialty

## 2015-04-18 VITALS — BP 134/74 | HR 73 | Temp 97.9°F | Ht 61.7 in | Wt 275.0 lb

## 2015-04-18 DIAGNOSIS — E1122 Type 2 diabetes mellitus with diabetic chronic kidney disease: Secondary | ICD-10-CM

## 2015-04-18 DIAGNOSIS — Z6835 Body mass index (BMI) 35.0-35.9, adult: Secondary | ICD-10-CM

## 2015-04-18 DIAGNOSIS — Z0000 Encounter for general adult medical examination without abnormal findings: Secondary | ICD-10-CM | POA: Diagnosis not present

## 2015-04-18 DIAGNOSIS — N182 Chronic kidney disease, stage 2 (mild): Secondary | ICD-10-CM | POA: Diagnosis not present

## 2015-04-18 NOTE — Progress Notes (Signed)
BP 134/74 mmHg  Pulse 73  Temp(Src) 97.9 F (36.6 C)  Ht 5' 1.7" (1.567 m)  Wt 275 lb (124.739 kg)  BMI 50.80 kg/m2  SpO2 97%  LMP  (LMP Unknown)   Subjective:    Patient ID: Priscilla HarpsErnestine Mierzejewski, female    DOB: [birth year], 69 y.o.   MRN: [id number]  HPI: Priscilla Johnson is a 69 y.o. female  Chief Complaint  Patient presents with  . Medicare Wellness   chronic illness visit 3 months ago.  Everythig stable and due for labs in 3 months.  Fall Risk  04/18/2015 01/16/2015  Falls in the past year? No No   Dep[facility] screen [facility] 04/18/2015 01/16/2015  Decreased Interest 0 0  Down, Depressed, Hopeless 0 0  PHQ - 2 Score 0 0    Functional Status Survey: Is the patient deaf or have difficulty hearing?: No Does the patient have difficulty seeing, even when wearing glasses/contacts?: No Does the patient have difficulty concentrating, remembering, or making decisions?: No Does the patient have difficulty walking or climbing stairs?: Yes (climbing stairs) Does the patient have difficulty dressing or bathing?: No Does the patient have difficulty doing errands alone such as visiting a doctor's office or shopping?: No   Pt is able to perform complex mental tasks, recognize clock face, recognize time and do a 3 item recall.     Relevant past medical, surgical, family and social history reviewed and updated as indicated. Interim medical history since our last visit reviewed. Allergies and medications reviewed and updated.  Review of Systems  Per HPI unless specifically indicated above     Objective:    BP 134/74 mmHg  Pulse 73  Temp(Src) 97.9 F (36.6 C)  Ht 5' 1.7" (1.567 m)  Wt 275 lb (124.739 kg)  BMI 50.80 kg/m2  SpO2 97%  LMP  (LMP Unknown)  Wt Readings from Last 3 Encounters:  04/18/15 275 lb (124.739 kg)  01/16/15 280 lb 6.4 oz (127.189 kg)  07/16/14 274 lb (124.286 kg)    Physical Exam  Constitutional: She is oriented to person, place, and time. She  appears well-developed and well-nourished.  HENT:  Head: Normocephalic and atraumatic.  Eyes: Pupils are equal, round, and reactive to light. Right eye exhibits no discharge. Left eye exhibits no discharge. No scleral icterus.  Neck: Normal range of motion. Neck supple. Carotid bruit is not present. No thyromegaly present.  Cardiovascular: Normal rate, regular rhythm and normal heart sounds.  Exam reveals no gallop and no friction rub.   No murmur heard. Pulmonary/Chest: Effort normal and breath sounds normal. No respiratory distress. She has no wheezes. She has no rales.  Abdominal: Soft. Bowel sounds are normal. There is no tenderness. There is no rebound.  Genitourinary: No breast swelling, tenderness or discharge.  Musculoskeletal: Normal range of motion.  Lymphadenopathy:    She has no cervical adenopathy.  Neurological: She is alert and oriented to person, place, and time.  Skin: Skin is warm, dry and intact. No rash noted.  Psychiatric: She has a normal mood and affect. Her speech is normal and behavior is normal. Judgment and thought content normal. Cognition and memory are normal.    Results for orders placed or performed in visit on 01/16/15  Bayer DCA Hb A1c Waived  Result Value Ref Range   Bayer DCA Hb A1c Waived 6.3 <7.0 %  Lipid Panel Piccolo, Waived  Result Value Ref Range   Cholesterol Piccolo, Waived 152 <200 mg/dL   HDL Chol  Piccolo, Waived 52 (L) >59 mg/dL   Triglycerides Piccolo,Waived 124 <150 mg/dL   Chol/HDL Ratio Piccolo,Waive 2.9 mg/dL   LDL Chol Calc Piccolo Waived 75 <100 mg/dL   VLDL Chol Calc Piccolo,Waive 25 <30 mg/dL  Comprehensive metabolic panel  Result Value Ref Range   Glucose 81 65 - 99 mg/dL   BUN 18 8 - 27 mg/dL   Creatinine, Ser 1.47 0.57 - 1.00 mg/dL   GFR calc non Af Amer 63 >59 mL/min/1.73   GFR calc Af Amer 73 >59 mL/min/1.73   BUN/Creatinine Ratio 20 11 - 26   Sodium 139 134 - 144 mmol/L   Potassium 4.2 3.5 - 5.2 mmol/L   Chloride 97  97 - 108 mmol/L   CO2 25 18 - 29 mmol/L   Calcium 9.1 8.7 - 10.3 mg/dL   Total Protein 5.9 (L) 6.0 - 8.5 g/dL   Albumin 3.5 3.5 - 4.8 g/dL   Globulin, Total 2.4 1.5 - 4.5 g/dL   Albumin/Globulin Ratio 1.5 1.1 - 2.5   Bilirubin Total 0.3 0.0 - 1.2 mg/dL   Alkaline Phosphatase 120 (H) 39 - 117 IU/L   AST 11 0 - 40 IU/L   ALT 8 0 - 32 IU/L      Assessment & Plan:   Problem List Items Addressed This Visit      Unprioritized   Type II diabetes mellitus with renal manifestations (HCC)   Relevant Orders   Ambulatory referral to diabetic education   Severe obesity (BMI 35.0-35.9 with comorbidity) (HCC) - Primary   Relevant Orders   Ambulatory referral to diabetic education    Other Visit Diagnoses    Annual physical exam            Follow up plan: Return in about 3 months (around 07/17/2015).

## 2015-05-05 ENCOUNTER — Other Ambulatory Visit: Payer: Self-pay | Admitting: Unknown Physician Specialty

## 2015-06-09 ENCOUNTER — Other Ambulatory Visit: Payer: Self-pay | Admitting: Family Medicine

## 2015-06-14 ENCOUNTER — Ambulatory Visit: Payer: Medicare Other | Admitting: Sports Medicine

## 2015-06-21 ENCOUNTER — Ambulatory Visit (INDEPENDENT_AMBULATORY_CARE_PROVIDER_SITE_OTHER): Payer: Medicare Other | Admitting: Sports Medicine

## 2015-06-21 ENCOUNTER — Encounter: Payer: Self-pay | Admitting: Sports Medicine

## 2015-06-21 DIAGNOSIS — E119 Type 2 diabetes mellitus without complications: Secondary | ICD-10-CM | POA: Diagnosis not present

## 2015-06-21 DIAGNOSIS — M79673 Pain in unspecified foot: Secondary | ICD-10-CM

## 2015-06-21 DIAGNOSIS — B351 Tinea unguium: Secondary | ICD-10-CM

## 2015-06-21 DIAGNOSIS — L309 Dermatitis, unspecified: Secondary | ICD-10-CM

## 2015-06-21 NOTE — Progress Notes (Signed)
Patient ID: Priscilla Johnson, female   DOB: 05-May-1945, 70 y.o.   MRN: [id number] Subjective: Priscilla Johnson is a 70 y.o. female patient with history of type 2 diabetes who presents to office today complaining of long, painful nails  while ambulating in shoes; unable to trim. Patient states that the glucose reading this morning was not recorded but A1c 6.1. Patient states that rash is better on legs. Patient denies any new changes in medications. Patient denies any new cramping, numbness, burning or tingling in the legs.  Admits that she just recently got over an gout attack.  Patient Active Problem List   Diagnosis Date Noted  . Severe obesity (BMI 35.0-35.9 with comorbidity) (HCC) 04/18/2015  . Insomnia 01/16/2015  . Sleep apnea 01/16/2015  . Type II diabetes mellitus with renal manifestations (HCC) 01/16/2015  . Hypertensive CKD (chronic kidney disease) 01/16/2015  . Severe obesity (HCC) 01/16/2015  . CKD (chronic kidney disease), stage III 01/16/2015  . Hyperlipidemia 01/16/2015   Current Outpatient Prescriptions on File Prior to Visit  Medication Sig Dispense Refill  . amLODipine (NORVASC) 10 MG tablet Take 1 tablet (10 mg total) by mouth daily. 90 tablet 1  . aspirin 81 MG tablet Take 81 mg by mouth daily.    Marland Kitchen. atenolol (TENORMIN) 100 MG tablet Take 1 tablet (100 mg total) by mouth daily. 90 tablet 1  . atorvastatin (LIPITOR) 40 MG tablet Take 1 tablet (40 mg total) by mouth daily. 90 tablet 1  . BD PEN NEEDLE NANO U/F 32G X 4 MM MISC USE ONCE DAILY 100 each 0  . fluocinonide ointment (LIDEX) 0.05 %     . glipiZIDE (GLUCOTROL) 5 MG tablet TAKE 1 TABLET(5 MG) BY MOUTH TWICE DAILY BEFORE A MEAL 180 tablet 0  . Liraglutide (VICTOZA) 18 MG/3ML SOPN Inject 1.8 mLs (10.8 mg total) into the skin daily. 3 pen 12  . lisinopril-hydrochlorothiazide (PRINZIDE,ZESTORETIC) 20-25 MG tablet Take 1 tablet by mouth daily. 90 tablet 1  . meloxicam (MOBIC) 7.5 MG tablet Take 7.5 mg by mouth 2 (two)  times daily.    . metFORMIN (GLUCOPHAGE) 500 MG tablet Take 1 tablet (500 mg total) by mouth 2 (two) times daily. 180 tablet 1  . pioglitazone (ACTOS) 30 MG tablet Take 1 tablet (30 mg total) by mouth daily. 90 tablet 1   No current facility-administered medications on file prior to visit.   No Known Allergies  Objective: General: Patient is awake, alert, and oriented x 3 and in no acute distress.  Integument: Skin is warm, dry and supple bilateral. Nails are tender, long, thickened and  dystrophic with subungual debris, consistent with onychomycosis, 1-5 bilateral. Rash improved to anterior shins. No open lesions or preulcerative lesions present bilateral. Remaining integument unremarkable.  Vasculature:  Dorsalis Pedis pulse 1/4 bilateral. Posterior Tibial pulse  1/4 bilateral.  Capillary fill time <3 sec 1-5 bilateral. Scant hair growth to the level of the digits. Temperature gradient within normal limits. No varicosities present bilateral. No edema present bilateral.   Neurology: The patient has intact sensation measured with a 5.07/10g Semmes Weinstein Monofilament at all pedal sites bilateral . Vibratory sensation intact bilateral with tuning fork. No Babinski sign present bilateral.   Musculoskeletal: No gross pedal deformities noted bilateral. Muscular strength 5/5 in all lower extremity muscular groups bilateral without pain or limitation on range of motion . No tenderness with calf compression bilateral.  Assessment and Plan: Problem List Items Addressed This Visit    None    Visit  Diagnoses    Dermatophytosis of nail    -  Primary    Foot pain, unspecified laterality        Dermatitis        Improved    Diabetes mellitus without complication (HCC)           -Examined patient. -Discussed and educated patient on diabetic foot care, especially with  regards to the vascular, neurological and musculoskeletal systems.  -Stressed the importance of good glycemic control and  the detriment of not  controlling glucose levels in relation to the foot. -Mechanically debrided all nails 1-5 bilateral using sterile nail nipper and filed with dremel without incident  -Rash improved on Lotrisone cream -Answered all patient questions -Patient to return  in 3 months for at risk foot care.  -Patient advised to call the office if any problems or questions arise in the meantime.  Asencion Islam, DPM

## 2015-07-15 ENCOUNTER — Other Ambulatory Visit: Payer: Self-pay | Admitting: Unknown Physician Specialty

## 2015-07-17 ENCOUNTER — Ambulatory Visit: Payer: Medicare Other | Admitting: Unknown Physician Specialty

## 2015-07-24 ENCOUNTER — Ambulatory Visit: Payer: Medicare Other | Admitting: Unknown Physician Specialty

## 2015-07-29 ENCOUNTER — Telehealth: Payer: Self-pay | Admitting: *Deleted

## 2015-07-29 ENCOUNTER — Telehealth: Payer: Self-pay | Admitting: Sports Medicine

## 2015-07-29 MED ORDER — COLCHICINE 0.6 MG PO TABS: 0.6000 mg | ORAL_TABLET | Freq: Two times a day (BID) | ORAL | Status: DC

## 2015-07-29 NOTE — Telephone Encounter (Addendum)
Pt states she saw Dr. Marylene LandStover about 1 month about 1 month ago and would like rx for gout.  Informed pt that Dr. Marylene LandStover ordered colchicine and if no better after taking to come in to be seen in office.

## 2015-07-29 NOTE — Telephone Encounter (Signed)
Refill acceptable. Send as last note. Thanks Dr. Marylene LandStover

## 2015-07-29 NOTE — Telephone Encounter (Signed)
Left message informing pt [facility] had not seen her in over 1 year for gout flare up and I would advise Dr. Marylene LandStover of her request, but she may be ordered to set up an appt.

## 2015-07-29 NOTE — Telephone Encounter (Signed)
PATIENT NEEDS TO BE EVALUATED IF PATIENT CAN NOT WAIT TO BE SEEN THEN SHOULD GO TO ER OR URGENT CARE. THANKS DR. Marylene LandSTOVER

## 2015-07-29 NOTE — Telephone Encounter (Signed)
PATIENT SAID THAT DR STOVER TOLD HER NEXT TIME HER GOUT FLAIRS UP TO CALL HER FOR AN PRESCRIPTION. PT REQUESTED IT BE CALLED INTO [facility], ON [address].

## 2015-08-09 ENCOUNTER — Other Ambulatory Visit: Payer: Self-pay | Admitting: Unknown Physician Specialty

## 2015-08-23 ENCOUNTER — Encounter: Payer: Self-pay | Admitting: Unknown Physician Specialty

## 2015-08-23 ENCOUNTER — Ambulatory Visit (INDEPENDENT_AMBULATORY_CARE_PROVIDER_SITE_OTHER): Payer: Medicare Other | Admitting: Unknown Physician Specialty

## 2015-08-23 VITALS — BP 129/76 | HR 73 | Temp 98.1°F | Ht 62.0 in | Wt 280.8 lb

## 2015-08-23 DIAGNOSIS — I10 Essential (primary) hypertension: Secondary | ICD-10-CM | POA: Diagnosis not present

## 2015-08-23 DIAGNOSIS — E785 Hyperlipidemia, unspecified: Secondary | ICD-10-CM | POA: Diagnosis not present

## 2015-08-23 DIAGNOSIS — I129 Hypertensive chronic kidney disease with stage 1 through stage 4 chronic kidney disease, or unspecified chronic kidney disease: Secondary | ICD-10-CM

## 2015-08-23 DIAGNOSIS — N182 Chronic kidney disease, stage 2 (mild): Secondary | ICD-10-CM

## 2015-08-23 DIAGNOSIS — E1122 Type 2 diabetes mellitus with diabetic chronic kidney disease: Secondary | ICD-10-CM | POA: Diagnosis not present

## 2015-08-23 DIAGNOSIS — Z0000 Encounter for general adult medical examination without abnormal findings: Secondary | ICD-10-CM

## 2015-08-23 LAB — MICROALBUMIN, URINE WAIVED
Creatinine, Urine Waived: 50 mg/dL (ref 10–300)
Microalb, Ur Waived: 30 mg/L — ABNORMAL HIGH (ref 0–19)

## 2015-08-23 LAB — BAYER DCA HB A1C WAIVED: HB A1C (BAYER DCA - WAIVED): 6.1 % (ref <7.0)

## 2015-08-23 NOTE — Progress Notes (Signed)
BP 129/76 mmHg  Pulse 73  Temp(Src) 98.1 F (36.7 C)  Ht  (1.575 m)  Wt 280 lb 12.8 oz (127.37 kg)  BMI 51.35 kg/m2  SpO2 93%  LMP  (LMP Unknown)   Subjective:    Patient ID: Priscilla Johnson, female    DOB: 03-29-1946, 70 y.o.   MRN: [id number]  HPI: Priscilla Johnson is a 70 y.o. female  Chief Complaint  Patient presents with  . Diabetes  . Hyperlipidemia  . Hypertension  . other    Hep C order entered. Pt states she has rx to go get shingles vaccine but has not done that yet   Diabetes:  Using medications without difficulties Hypoglycemic episodes: none Hyperglycemic episodes:none Feet problems:none Blood Sugars averaging: Not checking   Hypertension:    Using medication without problems or lightheadedness  No nhest pain with exertion: No edema No shortness of breath Average home BPs: not checking   Elevated Cholesterol: Using medications without problems No muscle aches  Diet compliance: good and tries to limit sugar Exercise: none    Relevant past medical, surgical, family and social history reviewed and updated as indicated. Interim medical history since our last visit reviewed. Allergies and medications reviewed and updated.  Review of Systems  Per HPI unless specifically indicated above     Objective:    BP 129/76 mmHg  Pulse 73  Temp(Src) 98.1 F (36.7 C)  Ht  (1.575 m)  Wt 280 lb 12.8 oz (127.37 kg)  BMI 51.35 kg/m2  SpO2 93%  LMP  (LMP Unknown)  Wt Readings from Last 3 Encounters:  08/23/15 280 lb 12.8 oz (127.37 kg)  04/18/15 275 lb (124.739 kg)  01/16/15 280 lb 6.4 oz (127.189 kg)    Physical Exam  Constitutional: She is oriented to person, place, and time. She appears well-developed and well-nourished. No distress.  HENT:  Head: Normocephalic and atraumatic.  Eyes: Conjunctivae and lids are normal. Right eye exhibits no discharge. Left eye exhibits no discharge. No scleral icterus.  Neck: Normal range of motion.  Neck supple. No JVD present. Carotid bruit is not present.  Cardiovascular: Normal rate, regular rhythm and normal heart sounds.   Pulmonary/Chest: Effort normal and breath sounds normal.  Abdominal: Normal appearance. There is no splenomegaly or hepatomegaly.  Musculoskeletal: Normal range of motion.  Neurological: She is alert and oriented to person, place, and time.  Skin: Skin is warm, dry and intact. No rash noted. No pallor.  Psychiatric: She has a normal mood and affect. Her behavior is normal. Judgment and thought content normal.    Results for orders placed or performed in visit on 01/16/15  Bayer DCA Hb A1c Waived  Result Value Ref Range   Bayer DCA Hb A1c Waived 6.3 <7.0 %  Lipid Panel Piccolo, Waived  Result Value Ref Range   Cholesterol Piccolo, Waived 152 <200 mg/dL   HDL Chol Piccolo, Waived 52 (L) >59 mg/dL   Triglycerides Piccolo,Waived 124 <150 mg/dL   Chol/HDL Ratio Piccolo,Waive 2.9 mg/dL   LDL Chol Calc Piccolo Waived 75 <100 mg/dL   VLDL Chol Calc Piccolo,Waive 25 <30 mg/dL  Comprehensive metabolic panel  Result Value Ref Range   Glucose 81 65 - 99 mg/dL   BUN 18 8 - 27 mg/dL   Creatinine, Ser 4.78 0.57 - 1.00 mg/dL   GFR calc non Af Amer 63 >59 mL/min/1.73   GFR calc Af Amer 73 >59 mL/min/1.73   BUN/Creatinine Ratio 20 11 - 26  Sodium 139 134 - 144 mmol/L   Potassium 4.2 3.5 - 5.2 mmol/L   Chloride 97 97 - 108 mmol/L   CO2 25 18 - 29 mmol/L   Calcium 9.1 8.7 - 10.3 mg/dL   Total Protein 5.9 (L) 6.0 - 8.5 g/dL   Albumin 3.5 3.5 - 4.8 g/dL   Globulin, Total 2.4 1.5 - 4.5 g/dL   Albumin/Globulin Ratio 1.5 1.1 - 2.5   Bilirubin Total 0.3 0.0 - 1.2 mg/dL   Alkaline Phosphatase 120 (H) 39 - 117 IU/L   AST 11 0 - 40 IU/L   ALT 8 0 - 32 IU/L      Assessment & Plan:   Problem List Items Addressed This Visit      Unprioritized   Essential hypertension   Relevant Orders   Uric acid   Microalbumin, Urine Waived   Comprehensive metabolic panel    Hyperlipidemia   Relevant Orders   Lipid Panel w/o Chol/HDL Ratio   Hypertensive CKD (chronic kidney disease)    Stable, continue present medications.        Type II diabetes mellitus with renal manifestations (HCC)    Stable, continue present medications. Hgb A1C is 6.1%.  Continue present medications       Relevant Orders   Bayer DCA Hb A1c Waived    Other Visit Diagnoses    Health care maintenance    -  Primary    Relevant Orders    Hepatitis C antibody       Follow up plan: Return in about 6 months (around 02/23/2016) for physical.

## 2015-08-23 NOTE — Assessment & Plan Note (Signed)
Stable, continue present medications. Hgb A1C is 6.1%.  Continue present medications

## 2015-08-23 NOTE — Assessment & Plan Note (Signed)
Stable, continue present medications.   

## 2015-08-24 ENCOUNTER — Encounter: Payer: Self-pay | Admitting: Unknown Physician Specialty

## 2015-08-24 LAB — COMPREHENSIVE METABOLIC PANEL
ALBUMIN: 3.9 g/dL (ref 3.5–4.8)
ALT: 14 IU/L (ref 0–32)
AST: 17 IU/L (ref 0–40)
Albumin/Globulin Ratio: 1.6 (ref 1.2–2.2)
Alkaline Phosphatase: 118 IU/L — ABNORMAL HIGH (ref 39–117)
BUN/Creatinine Ratio: 22 (ref 12–28)
BUN: 22 mg/dL (ref 8–27)
Bilirubin Total: 0.2 mg/dL (ref 0.0–1.2)
CALCIUM: 9.3 mg/dL (ref 8.7–10.3)
CO2: 27 mmol/L (ref 18–29)
CREATININE: 0.98 mg/dL (ref 0.57–1.00)
Chloride: 96 mmol/L (ref 96–106)
GFR calc Af Amer: 67 mL/min/{1.73_m2} (ref >59)
GFR, EST NON AFRICAN AMERICAN: 58 mL/min/{1.73_m2} — AB (ref >59)
GLOBULIN, TOTAL: 2.4 g/dL (ref 1.5–4.5)
Glucose: 66 mg/dL (ref 65–99)
Potassium: 5 mmol/L (ref 3.5–5.2)
SODIUM: 140 mmol/L (ref 134–144)
TOTAL PROTEIN: 6.3 g/dL (ref 6.0–8.5)

## 2015-08-24 LAB — LIPID PANEL W/O CHOL/HDL RATIO
Cholesterol, Total: 158 mg/dL (ref 100–199)
HDL: 53 mg/dL (ref >39)
LDL CALC: 78 mg/dL (ref 0–99)
TRIGLYCERIDES: 137 mg/dL (ref 0–149)
VLDL Cholesterol Cal: 27 mg/dL (ref 5–40)

## 2015-08-24 LAB — URIC ACID: Uric Acid: 9.3 mg/dL — ABNORMAL HIGH (ref 2.5–7.1)

## 2015-08-24 LAB — HEPATITIS C ANTIBODY: Hep C Virus Ab: <0.1 s/co ratio (ref 0.0–0.9)

## 2015-08-30 DIAGNOSIS — G8929 Other chronic pain: Secondary | ICD-10-CM | POA: Diagnosis not present

## 2015-08-30 DIAGNOSIS — M25572 Pain in left ankle and joints of left foot: Secondary | ICD-10-CM | POA: Diagnosis not present

## 2015-08-30 DIAGNOSIS — M25511 Pain in right shoulder: Secondary | ICD-10-CM | POA: Diagnosis not present

## 2015-09-15 ENCOUNTER — Other Ambulatory Visit: Payer: Self-pay | Admitting: Unknown Physician Specialty

## 2015-09-27 ENCOUNTER — Ambulatory Visit: Payer: Medicare Other | Admitting: Sports Medicine

## 2015-10-04 DIAGNOSIS — M7521 Bicipital tendinitis, right shoulder: Secondary | ICD-10-CM | POA: Diagnosis not present

## 2015-10-04 DIAGNOSIS — M7581 Other shoulder lesions, right shoulder: Secondary | ICD-10-CM | POA: Diagnosis not present

## 2015-10-11 ENCOUNTER — Other Ambulatory Visit: Payer: Self-pay | Admitting: Family Medicine

## 2015-10-14 NOTE — Telephone Encounter (Signed)
Your patient 

## 2015-10-31 DIAGNOSIS — M7521 Bicipital tendinitis, right shoulder: Secondary | ICD-10-CM | POA: Diagnosis not present

## 2015-10-31 DIAGNOSIS — M7581 Other shoulder lesions, right shoulder: Secondary | ICD-10-CM | POA: Diagnosis not present

## 2015-10-31 DIAGNOSIS — Z7984 Long term (current) use of oral hypoglycemic drugs: Secondary | ICD-10-CM | POA: Diagnosis not present

## 2015-10-31 DIAGNOSIS — M109 Gout, unspecified: Secondary | ICD-10-CM | POA: Diagnosis not present

## 2015-10-31 DIAGNOSIS — E119 Type 2 diabetes mellitus without complications: Secondary | ICD-10-CM | POA: Diagnosis not present

## 2015-10-31 DIAGNOSIS — Z79891 Long term (current) use of opiate analgesic: Secondary | ICD-10-CM | POA: Diagnosis not present

## 2015-10-31 DIAGNOSIS — E785 Hyperlipidemia, unspecified: Secondary | ICD-10-CM | POA: Diagnosis not present

## 2015-10-31 DIAGNOSIS — Z791 Long term (current) use of non-steroidal anti-inflammatories (NSAID): Secondary | ICD-10-CM | POA: Diagnosis not present

## 2015-10-31 DIAGNOSIS — I10 Essential (primary) hypertension: Secondary | ICD-10-CM | POA: Diagnosis not present

## 2015-11-01 ENCOUNTER — Ambulatory Visit: Payer: Medicare Other | Admitting: Sports Medicine

## 2015-11-01 DIAGNOSIS — M7521 Bicipital tendinitis, right shoulder: Secondary | ICD-10-CM | POA: Diagnosis not present

## 2015-11-01 DIAGNOSIS — I10 Essential (primary) hypertension: Secondary | ICD-10-CM | POA: Diagnosis not present

## 2015-11-01 DIAGNOSIS — Z791 Long term (current) use of non-steroidal anti-inflammatories (NSAID): Secondary | ICD-10-CM | POA: Diagnosis not present

## 2015-11-01 DIAGNOSIS — E785 Hyperlipidemia, unspecified: Secondary | ICD-10-CM | POA: Diagnosis not present

## 2015-11-01 DIAGNOSIS — M109 Gout, unspecified: Secondary | ICD-10-CM | POA: Diagnosis not present

## 2015-11-01 DIAGNOSIS — Z7984 Long term (current) use of oral hypoglycemic drugs: Secondary | ICD-10-CM | POA: Diagnosis not present

## 2015-11-01 DIAGNOSIS — M7581 Other shoulder lesions, right shoulder: Secondary | ICD-10-CM | POA: Diagnosis not present

## 2015-11-01 DIAGNOSIS — Z79891 Long term (current) use of opiate analgesic: Secondary | ICD-10-CM | POA: Diagnosis not present

## 2015-11-01 DIAGNOSIS — E119 Type 2 diabetes mellitus without complications: Secondary | ICD-10-CM | POA: Diagnosis not present

## 2015-11-03 ENCOUNTER — Other Ambulatory Visit: Payer: Self-pay | Admitting: Family Medicine

## 2015-11-04 DIAGNOSIS — M7521 Bicipital tendinitis, right shoulder: Secondary | ICD-10-CM | POA: Diagnosis not present

## 2015-11-04 DIAGNOSIS — M7581 Other shoulder lesions, right shoulder: Secondary | ICD-10-CM | POA: Diagnosis not present

## 2015-11-04 DIAGNOSIS — E119 Type 2 diabetes mellitus without complications: Secondary | ICD-10-CM | POA: Diagnosis not present

## 2015-11-04 DIAGNOSIS — I10 Essential (primary) hypertension: Secondary | ICD-10-CM | POA: Diagnosis not present

## 2015-11-04 DIAGNOSIS — Z79891 Long term (current) use of opiate analgesic: Secondary | ICD-10-CM | POA: Diagnosis not present

## 2015-11-04 DIAGNOSIS — E785 Hyperlipidemia, unspecified: Secondary | ICD-10-CM | POA: Diagnosis not present

## 2015-11-04 DIAGNOSIS — M109 Gout, unspecified: Secondary | ICD-10-CM | POA: Diagnosis not present

## 2015-11-04 DIAGNOSIS — Z7984 Long term (current) use of oral hypoglycemic drugs: Secondary | ICD-10-CM | POA: Diagnosis not present

## 2015-11-04 DIAGNOSIS — Z791 Long term (current) use of non-steroidal anti-inflammatories (NSAID): Secondary | ICD-10-CM | POA: Diagnosis not present

## 2015-11-05 ENCOUNTER — Ambulatory Visit: Payer: Medicare Other | Admitting: Sports Medicine

## 2015-11-08 ENCOUNTER — Ambulatory Visit: Payer: Medicare Other | Admitting: Sports Medicine

## 2015-11-12 DIAGNOSIS — Z79891 Long term (current) use of opiate analgesic: Secondary | ICD-10-CM | POA: Diagnosis not present

## 2015-11-12 DIAGNOSIS — Z791 Long term (current) use of non-steroidal anti-inflammatories (NSAID): Secondary | ICD-10-CM | POA: Diagnosis not present

## 2015-11-12 DIAGNOSIS — M7581 Other shoulder lesions, right shoulder: Secondary | ICD-10-CM | POA: Diagnosis not present

## 2015-11-12 DIAGNOSIS — M109 Gout, unspecified: Secondary | ICD-10-CM | POA: Diagnosis not present

## 2015-11-12 DIAGNOSIS — I10 Essential (primary) hypertension: Secondary | ICD-10-CM | POA: Diagnosis not present

## 2015-11-12 DIAGNOSIS — E785 Hyperlipidemia, unspecified: Secondary | ICD-10-CM | POA: Diagnosis not present

## 2015-11-12 DIAGNOSIS — M7521 Bicipital tendinitis, right shoulder: Secondary | ICD-10-CM | POA: Diagnosis not present

## 2015-11-12 DIAGNOSIS — Z7984 Long term (current) use of oral hypoglycemic drugs: Secondary | ICD-10-CM | POA: Diagnosis not present

## 2015-11-12 DIAGNOSIS — E119 Type 2 diabetes mellitus without complications: Secondary | ICD-10-CM | POA: Diagnosis not present

## 2015-11-14 DIAGNOSIS — M109 Gout, unspecified: Secondary | ICD-10-CM | POA: Diagnosis not present

## 2015-11-14 DIAGNOSIS — Z7984 Long term (current) use of oral hypoglycemic drugs: Secondary | ICD-10-CM | POA: Diagnosis not present

## 2015-11-14 DIAGNOSIS — M7521 Bicipital tendinitis, right shoulder: Secondary | ICD-10-CM | POA: Diagnosis not present

## 2015-11-14 DIAGNOSIS — E785 Hyperlipidemia, unspecified: Secondary | ICD-10-CM | POA: Diagnosis not present

## 2015-11-14 DIAGNOSIS — E119 Type 2 diabetes mellitus without complications: Secondary | ICD-10-CM | POA: Diagnosis not present

## 2015-11-14 DIAGNOSIS — Z79891 Long term (current) use of opiate analgesic: Secondary | ICD-10-CM | POA: Diagnosis not present

## 2015-11-14 DIAGNOSIS — M7581 Other shoulder lesions, right shoulder: Secondary | ICD-10-CM | POA: Diagnosis not present

## 2015-11-14 DIAGNOSIS — I10 Essential (primary) hypertension: Secondary | ICD-10-CM | POA: Diagnosis not present

## 2015-11-14 DIAGNOSIS — Z791 Long term (current) use of non-steroidal anti-inflammatories (NSAID): Secondary | ICD-10-CM | POA: Diagnosis not present

## 2015-11-18 DIAGNOSIS — Z7984 Long term (current) use of oral hypoglycemic drugs: Secondary | ICD-10-CM | POA: Diagnosis not present

## 2015-11-18 DIAGNOSIS — M109 Gout, unspecified: Secondary | ICD-10-CM | POA: Diagnosis not present

## 2015-11-18 DIAGNOSIS — E785 Hyperlipidemia, unspecified: Secondary | ICD-10-CM | POA: Diagnosis not present

## 2015-11-18 DIAGNOSIS — Z791 Long term (current) use of non-steroidal anti-inflammatories (NSAID): Secondary | ICD-10-CM | POA: Diagnosis not present

## 2015-11-18 DIAGNOSIS — Z79891 Long term (current) use of opiate analgesic: Secondary | ICD-10-CM | POA: Diagnosis not present

## 2015-11-18 DIAGNOSIS — M7521 Bicipital tendinitis, right shoulder: Secondary | ICD-10-CM | POA: Diagnosis not present

## 2015-11-18 DIAGNOSIS — M7581 Other shoulder lesions, right shoulder: Secondary | ICD-10-CM | POA: Diagnosis not present

## 2015-11-18 DIAGNOSIS — E119 Type 2 diabetes mellitus without complications: Secondary | ICD-10-CM | POA: Diagnosis not present

## 2015-11-18 DIAGNOSIS — I10 Essential (primary) hypertension: Secondary | ICD-10-CM | POA: Diagnosis not present

## 2015-11-21 DIAGNOSIS — M7581 Other shoulder lesions, right shoulder: Secondary | ICD-10-CM | POA: Diagnosis not present

## 2015-11-21 DIAGNOSIS — M109 Gout, unspecified: Secondary | ICD-10-CM | POA: Diagnosis not present

## 2015-11-21 DIAGNOSIS — Z791 Long term (current) use of non-steroidal anti-inflammatories (NSAID): Secondary | ICD-10-CM | POA: Diagnosis not present

## 2015-11-21 DIAGNOSIS — E785 Hyperlipidemia, unspecified: Secondary | ICD-10-CM | POA: Diagnosis not present

## 2015-11-21 DIAGNOSIS — M7521 Bicipital tendinitis, right shoulder: Secondary | ICD-10-CM | POA: Diagnosis not present

## 2015-11-21 DIAGNOSIS — Z79891 Long term (current) use of opiate analgesic: Secondary | ICD-10-CM | POA: Diagnosis not present

## 2015-11-21 DIAGNOSIS — Z7984 Long term (current) use of oral hypoglycemic drugs: Secondary | ICD-10-CM | POA: Diagnosis not present

## 2015-11-21 DIAGNOSIS — I10 Essential (primary) hypertension: Secondary | ICD-10-CM | POA: Diagnosis not present

## 2015-11-21 DIAGNOSIS — E119 Type 2 diabetes mellitus without complications: Secondary | ICD-10-CM | POA: Diagnosis not present

## 2015-11-22 ENCOUNTER — Ambulatory Visit: Payer: Medicare Other | Admitting: Sports Medicine

## 2015-11-25 ENCOUNTER — Other Ambulatory Visit: Payer: Self-pay | Admitting: Sports Medicine

## 2015-11-25 DIAGNOSIS — I10 Essential (primary) hypertension: Secondary | ICD-10-CM | POA: Diagnosis not present

## 2015-11-25 DIAGNOSIS — Z791 Long term (current) use of non-steroidal anti-inflammatories (NSAID): Secondary | ICD-10-CM | POA: Diagnosis not present

## 2015-11-25 DIAGNOSIS — M7521 Bicipital tendinitis, right shoulder: Secondary | ICD-10-CM | POA: Diagnosis not present

## 2015-11-25 DIAGNOSIS — Z79891 Long term (current) use of opiate analgesic: Secondary | ICD-10-CM | POA: Diagnosis not present

## 2015-11-25 DIAGNOSIS — E785 Hyperlipidemia, unspecified: Secondary | ICD-10-CM | POA: Diagnosis not present

## 2015-11-25 DIAGNOSIS — M7581 Other shoulder lesions, right shoulder: Secondary | ICD-10-CM | POA: Diagnosis not present

## 2015-11-25 DIAGNOSIS — E119 Type 2 diabetes mellitus without complications: Secondary | ICD-10-CM | POA: Diagnosis not present

## 2015-11-25 DIAGNOSIS — Z7984 Long term (current) use of oral hypoglycemic drugs: Secondary | ICD-10-CM | POA: Diagnosis not present

## 2015-11-25 DIAGNOSIS — M109 Gout, unspecified: Secondary | ICD-10-CM | POA: Diagnosis not present

## 2015-11-29 DIAGNOSIS — E119 Type 2 diabetes mellitus without complications: Secondary | ICD-10-CM | POA: Diagnosis not present

## 2015-11-29 DIAGNOSIS — M109 Gout, unspecified: Secondary | ICD-10-CM | POA: Diagnosis not present

## 2015-11-29 DIAGNOSIS — Z791 Long term (current) use of non-steroidal anti-inflammatories (NSAID): Secondary | ICD-10-CM | POA: Diagnosis not present

## 2015-11-29 DIAGNOSIS — M7521 Bicipital tendinitis, right shoulder: Secondary | ICD-10-CM | POA: Diagnosis not present

## 2015-11-29 DIAGNOSIS — Z79891 Long term (current) use of opiate analgesic: Secondary | ICD-10-CM | POA: Diagnosis not present

## 2015-11-29 DIAGNOSIS — Z7984 Long term (current) use of oral hypoglycemic drugs: Secondary | ICD-10-CM | POA: Diagnosis not present

## 2015-11-29 DIAGNOSIS — E785 Hyperlipidemia, unspecified: Secondary | ICD-10-CM | POA: Diagnosis not present

## 2015-11-29 DIAGNOSIS — M7581 Other shoulder lesions, right shoulder: Secondary | ICD-10-CM | POA: Diagnosis not present

## 2015-11-29 DIAGNOSIS — I10 Essential (primary) hypertension: Secondary | ICD-10-CM | POA: Diagnosis not present

## 2015-12-03 ENCOUNTER — Other Ambulatory Visit: Payer: Self-pay | Admitting: Unknown Physician Specialty

## 2015-12-06 ENCOUNTER — Ambulatory Visit (INDEPENDENT_AMBULATORY_CARE_PROVIDER_SITE_OTHER): Payer: Medicare Other | Admitting: Sports Medicine

## 2015-12-06 ENCOUNTER — Encounter: Payer: Self-pay | Admitting: Sports Medicine

## 2015-12-06 DIAGNOSIS — M79673 Pain in unspecified foot: Secondary | ICD-10-CM | POA: Diagnosis not present

## 2015-12-06 DIAGNOSIS — B351 Tinea unguium: Secondary | ICD-10-CM

## 2015-12-06 DIAGNOSIS — E119 Type 2 diabetes mellitus without complications: Secondary | ICD-10-CM

## 2015-12-06 NOTE — Progress Notes (Signed)
Patient ID: Priscilla Johnson, female   DOB: 08-13-1945, 70 y.o.   MRN: [id number] Subjective: Priscilla Johnson is a 70 y.o. female patient with history of type 2 diabetes who presents to office today complaining of long, painful nails  while ambulating in shoes; unable to trim. Patient states that the glucose reading this morning was not recorded but A1c 6.1, same as previous. Patient denies any new changes in medications. Patient denies any new cramping, numbness, burning or tingling in the legs.  Patient Active Problem List   Diagnosis Date Noted  . Essential hypertension 08/23/2015  . Severe obesity (BMI 35.0-35.9 with comorbidity) (HCC) 04/18/2015  . Insomnia 01/16/2015  . Sleep apnea 01/16/2015  . Type II diabetes mellitus with renal manifestations (HCC) 01/16/2015  . Hypertensive CKD (chronic kidney disease) 01/16/2015  . Severe obesity (HCC) 01/16/2015  . CKD (chronic kidney disease), stage III 01/16/2015  . Hyperlipidemia 01/16/2015   Current Outpatient Prescriptions on File Prior to Visit  Medication Sig Dispense Refill  . amLODipine (NORVASC) 10 MG tablet TAKE 1 TABLET(10 MG) BY MOUTH DAILY 90 tablet 0  . aspirin 81 MG tablet Take 81 mg by mouth daily.    Marland Kitchen. atenolol (TENORMIN) 100 MG tablet TAKE 1 TABLET(100 MG) BY MOUTH DAILY 90 tablet 0  . atorvastatin (LIPITOR) 40 MG tablet TAKE 1 TABLET(40 MG) BY MOUTH DAILY 90 tablet 0  . BD PEN NEEDLE NANO U/F 32G X 4 MM MISC USE EVERY DAY 100 each 0  . fluocinonide ointment (LIDEX) 0.05 % Apply 1 application topically as needed.     Marland Kitchen. glipiZIDE (GLUCOTROL) 5 MG tablet TAKE 1 TABLET BY MOUTH TWICE DAILY 180 tablet 0  . Liraglutide (VICTOZA) 18 MG/3ML SOPN Inject 1.8 mLs (10.8 mg total) into the skin daily. 3 pen 12  . lisinopril-hydrochlorothiazide (PRINZIDE,ZESTORETIC) 20-25 MG tablet TAKE 1 TABLET BY MOUTH DAILY 90 tablet 0  . metFORMIN (GLUCOPHAGE) 500 MG tablet TAKE 1 TABLET(500 MG) BY MOUTH TWICE DAILY 180 tablet 0  . pioglitazone  (ACTOS) 30 MG tablet TAKE 1 TABLET(30 MG) BY MOUTH DAILY 90 tablet 0   No current facility-administered medications on file prior to visit.    No Known Allergies  Objective: General: Patient is awake, alert, and oriented x 3 and in no acute distress.  Integument: Skin is warm, dry and supple bilateral. Nails are tender, long, thickened and dystrophic with subungual debris, consistent with onychomycosis, 1-5 bilateral. No open lesions or preulcerative lesions present bilateral. Remaining integument unremarkable.  Vasculature:  Dorsalis Pedis pulse 1/4 bilateral. Posterior Tibial pulse  1/4 bilateral. Capillary fill time <3 sec 1-5 bilateral. Scant hair growth to the level of the digits.Temperature gradient within normal limits. No varicosities present bilateral. No edema present bilateral.   Neurology: The patient has intact sensation measured with a 5.07/10g Semmes Weinstein Monofilament at all pedal sites bilateral . Vibratory sensation intact bilateral with tuning fork. No Babinski sign present bilateral.   Musculoskeletal: No gross pedal deformities noted bilateral. Muscular strength 5/5 in all lower extremity muscular groups bilateral without pain or limitation on range of motion. No tenderness with calf compression bilateral.  Assessment and Plan: Problem List Items Addressed This Visit    None    Visit Diagnoses    Dermatophytosis of nail    -  Primary   Foot pain, unspecified laterality       Diabetes mellitus without complication (HCC)          -Examined patient. -Discussed and educated patient on diabetic  foot care, especially with  regards to the vascular, neurological and musculoskeletal systems.  -Stressed the importance of good glycemic control and the detriment of not  controlling glucose levels in relation to the foot. -Mechanically debrided all nails 1-5 bilateral using sterile nail nipper and filed with dremel without incident  -Answered all patient  questions -Patient to return  in 3 months for at risk foot care.  -Patient advised to call the office if any problems or questions arise in the meantime.  Asencion Islamitorya Royal Beirne, DPM

## 2015-12-22 ENCOUNTER — Other Ambulatory Visit: Payer: Self-pay | Admitting: Unknown Physician Specialty

## 2016-01-10 ENCOUNTER — Other Ambulatory Visit: Payer: Self-pay | Admitting: Family Medicine

## 2016-01-10 ENCOUNTER — Other Ambulatory Visit: Payer: Self-pay | Admitting: Unknown Physician Specialty

## 2016-01-10 NOTE — Telephone Encounter (Signed)
Your patient 

## 2016-01-17 ENCOUNTER — Other Ambulatory Visit: Payer: Self-pay

## 2016-01-17 MED ORDER — ATORVASTATIN CALCIUM 40 MG PO TABS: 40.0000 mg | ORAL_TABLET | Freq: Every day | ORAL | 3 refills | Status: DC

## 2016-01-20 ENCOUNTER — Encounter: Payer: Self-pay | Admitting: Unknown Physician Specialty

## 2016-01-20 ENCOUNTER — Other Ambulatory Visit: Payer: Self-pay

## 2016-01-20 ENCOUNTER — Ambulatory Visit (INDEPENDENT_AMBULATORY_CARE_PROVIDER_SITE_OTHER): Payer: Medicare Other | Admitting: Unknown Physician Specialty

## 2016-01-20 VITALS — BP 128/63 | HR 69 | Temp 97.8°F | Ht 62.1 in | Wt 285.8 lb

## 2016-01-20 DIAGNOSIS — Z23 Encounter for immunization: Secondary | ICD-10-CM | POA: Diagnosis not present

## 2016-01-20 DIAGNOSIS — F5101 Primary insomnia: Secondary | ICD-10-CM | POA: Diagnosis not present

## 2016-01-20 DIAGNOSIS — I10 Essential (primary) hypertension: Secondary | ICD-10-CM

## 2016-01-20 DIAGNOSIS — E782 Mixed hyperlipidemia: Secondary | ICD-10-CM | POA: Diagnosis not present

## 2016-01-20 DIAGNOSIS — E1122 Type 2 diabetes mellitus with diabetic chronic kidney disease: Secondary | ICD-10-CM

## 2016-01-20 DIAGNOSIS — N182 Chronic kidney disease, stage 2 (mild): Secondary | ICD-10-CM | POA: Diagnosis not present

## 2016-01-20 LAB — BAYER DCA HB A1C WAIVED: HB A1C: 5.8 % (ref <7.0)

## 2016-01-20 LAB — HEMOGLOBIN A1C: Hemoglobin A1C: 5.8

## 2016-01-20 MED ORDER — LORAZEPAM 0.5 MG PO TABS: 0.5000 mg | ORAL_TABLET | Freq: Every evening | ORAL | 0 refills | Status: DC | PRN

## 2016-01-20 NOTE — Assessment & Plan Note (Deleted)
Uses C-Pap machine.

## 2016-01-20 NOTE — Progress Notes (Signed)
BP 128/63 (BP Location: Left Arm, Patient Position: Sitting, Cuff Size: Large)   Pulse 69   Temp 97.8 F (36.6 C)   Ht 5' 2.1" (1.577 m)   Wt 285 lb 12.8 oz (129.6 kg)   LMP  (LMP Unknown)   SpO2 95%   BMI 52.11 kg/m    Subjective:    Patient ID: Priscilla HarpsErnestine Johnson, female    DOB: 23-Jan-1946, 70 y.o.   MRN: [id number]  HPI: Priscilla Johnson is a 70 y.o. female  Chief Complaint  Patient presents with  . Diabetes    pt states eye exam is scheduled for this month  . Hyperlipidemia  . Hypertension   Diabetes: Using medications without difficulties: Victoza too expensive. No problems with other medications.  No hypoglycemic episodes: no No hyperglycemic episodes: no Feet problems: none Blood Sugars averaging: not checking eye exam: scheduled for this month.  Last Hgb A1C: 6.1  Hypertension  Using medications without difficulty Average home BPs Not checking  Using medication without problems or lightheadedness  No chest pain with exertion or shortness of breath Reports mild swelling in ankles that subsides with elevation.   Elevated Cholesterol Using medications without problems No Muscle aches when taking lipitor every other day.  Diet: doesn't eat well, especially since she was laid off.  Exercise: walks around house, but no other exercise.   Insomnia Difficulty falling asleep and staying asleep.  Reports one cup of coffee in the morning. No daytime naps.  Has tried trazadone and melatonin, neither of which were effective.  Also tired ambien, which was effective, but is afraid of side effects.       Relevant past medical, surgical, family and social history reviewed and updated as indicated. Interim medical history since our last visit reviewed. Allergies and medications reviewed and updated.  Review of Systems  Respiratory: Negative for cough and shortness of breath.   Cardiovascular: Negative for chest pain and leg swelling.    Per HPI unless  specifically indicated above     Objective:    BP 128/63 (BP Location: Left Arm, Patient Position: Sitting, Cuff Size: Large)   Pulse 69   Temp 97.8 F (36.6 C)   Ht 5' 2.1" (1.577 m)   Wt 285 lb 12.8 oz (129.6 kg)   LMP  (LMP Unknown)   SpO2 95%   BMI 52.11 kg/m   Wt Readings from Last 3 Encounters:  01/20/16 285 lb 12.8 oz (129.6 kg)  08/23/15 280 lb 12.8 oz (127.4 kg)  04/18/15 275 lb (124.7 kg)    Physical Exam  Constitutional: She is oriented to person, place, and time. She appears well-developed and well-nourished. No distress.  Cardiovascular: Normal rate, regular rhythm and normal heart sounds.   Pulmonary/Chest: Effort normal and breath sounds normal.  Neurological: She is alert and oriented to person, place, and time.  Psychiatric: She has a normal mood and affect. Her behavior is normal. Judgment and thought content normal.    Results for orders placed or performed in visit on 01/20/16  Bayer DCA Hb A1c Waived  Result Value Ref Range   Bayer DCA Hb A1c Waived 5.8 <7.0 %      Assessment & Plan:   Problem List Items Addressed This Visit      Cardiovascular and Mediastinum   Essential hypertension    Stable, continue current medications.        Endocrine   Type II diabetes mellitus with renal manifestations (HCC)    Stable, A1C 5.8  today, continue current medications. Patient is going to look into assistance programs regarding price of Victoza.      Relevant Orders   Bayer DCA Hb A1c Waived (Completed)   Comprehensive metabolic panel     Other   Insomnia    Recommended exercise program.  Provided patient with resources about sleep hygiene. Prescribed Lorazepam as needed.         Severe obesity (HCC)    5lb weight gain since last visit. Discussed importance of diet and exercise.       Hyperlipidemia    Stable, continue current medications.        Other Visit Diagnoses    Need for influenza vaccination    -  Primary   Relevant Orders    Flu vaccine HIGH DOSE PF (Completed)       Follow up plan: Return in about 3 months (around 04/21/2016).

## 2016-01-20 NOTE — Assessment & Plan Note (Signed)
5lb weight gain since last visit. Discussed importance of diet and exercise.

## 2016-01-20 NOTE — Patient Instructions (Addendum)
Influenza (Flu) Vaccine (Inactivated or Recombinant):  1. Why get vaccinated? Influenza ("flu") is a contagious disease that spreads around the [place] every year, usually between October and May. Flu is caused by influenza viruses, and is spread mainly by coughing, sneezing, and close contact. Anyone can get flu. Flu strikes suddenly and can last several days. Symptoms vary by age, but can include:  fever/chills  sore throat  muscle aches  fatigue  cough  headache  runny or stuffy nose Flu can also lead to pneumonia and blood infections, and cause diarrhea and seizures in children. If you have a medical condition, such as heart or lung disease, flu can make it worse. Flu is more dangerous for some people. Infants and young children, people 65 years of age and older, pregnant women, and people with certain health conditions or a weakened immune system are at greatest risk. Each year thousands of people in the [place] die from flu, and many more are hospitalized. Flu vaccine can:  keep you from getting flu,  make flu less severe if you do get it, and  keep you from spreading flu to your family and other people. 2. Inactivated and recombinant flu vaccines A dose of flu vaccine is recommended every flu season. Children 6 months through 8 years of age may need two doses during the same flu season. Everyone else needs only one dose each flu season. Some inactivated flu vaccines contain a very small amount of a mercury-based preservative called thimerosal. Studies have not shown thimerosal in vaccines to be harmful, but flu vaccines that do not contain thimerosal are available. There is no live flu virus in flu shots. They cannot cause the flu. There are many flu viruses, and they are always changing. Each year a new flu vaccine is made to protect against three or four viruses that are likely to cause disease in the upcoming flu season. But even when the vaccine doesn't exactly  match these viruses, it may still provide some protection. Flu vaccine cannot prevent:  flu that is caused by a virus not covered by the vaccine, or  illnesses that look like flu but are not. It takes about 2 weeks for protection to develop after vaccination, and protection lasts through the flu season. 3. Some people should not get this vaccine Tell the person who is giving you the vaccine:  If you have any severe, life-threatening allergies. If you ever had a life-threatening allergic reaction after a dose of flu vaccine, or have a severe allergy to any part of this vaccine, you may be advised not to get vaccinated. Most, but not all, types of flu vaccine contain a small amount of egg protein.  If you ever had Guillain-Barre Syndrome (also called GBS). Some people with a history of GBS should not get this vaccine. This should be discussed with your doctor.  If you are not feeling well. It is usually okay to get flu vaccine when you have a mild illness, but you might be asked to come back when you feel better. 4. Risks of a vaccine reaction With any medicine, including vaccines, there is a chance of reactions. These are usually mild and go away on their own, but serious reactions are also possible. Most people who get a flu shot do not have any problems with it. Minor problems following a flu shot include:  soreness, redness, or swelling where the shot was given  hoarseness  sore, red or itchy eyes  cough    fever  aches  headache  itching  fatigue If these problems occur, they usually begin soon after the shot and last 1 or 2 days. More serious problems following a flu shot can include the following:  There may be a small increased risk of Guillain-Barre Syndrome (GBS) after inactivated flu vaccine. This risk has been estimated at 1 or 2 additional cases per million people vaccinated. This is much lower than the risk of severe complications from flu, which can be prevented by  flu vaccine.  Young children who get the flu shot along with pneumococcal vaccine (PCV13) and/or DTaP vaccine at the same time might be slightly more likely to have a seizure caused by fever. Ask your doctor for more information. Tell your doctor if a child who is getting flu vaccine has ever had a seizure. Problems that could happen after any injected vaccine:  People sometimes faint after a medical procedure, including vaccination. Sitting or lying down for about 15 minutes can help prevent fainting, and injuries caused by a fall. Tell your doctor if you feel dizzy, or have vision changes or ringing in the ears.  Some people get severe pain in the shoulder and have difficulty moving the arm where a shot was given. This happens very rarely.  Any medication can cause a severe allergic reaction. Such reactions from a vaccine are very rare, estimated at about 1 in a million doses, and would happen within a few minutes to a few hours after the vaccination. As with any medicine, there is a very remote chance of a vaccine causing a serious injury or death. The safety of vaccines is always being monitored. For more information, visit: www.cdc.gov/vaccinesafety/ 5. What if there is a serious reaction? What should I look for?  Look for anything that concerns you, such as signs of a severe allergic reaction, very high fever, or unusual behavior. Signs of a severe allergic reaction can include hives, swelling of the face and throat, difficulty breathing, a fast heartbeat, dizziness, and weakness. These would start a few minutes to a few hours after the vaccination. What should I do?  If you think it is a severe allergic reaction or other emergency that can't wait, call 9-1-1 and get the person to the nearest hospital. Otherwise, call your doctor.  Reactions should be reported to the Vaccine Adverse Event Reporting System (VAERS). Your doctor should file this report, or you can do it yourself through the  VAERS web site at www.vaers.hhs.gov, or by calling 1-800-822-7967. VAERS does not give medical advice. 6. The National Vaccine Injury Compensation Program The National Vaccine Injury Compensation Program (VICP) is a federal program that was created to compensate people who may have been injured by certain vaccines. Persons who believe they may have been injured by a vaccine can learn about the program and about filing a claim by calling 1-800-338-2382 or visiting the VICP website at www.hrsa.gov/vaccinecompensation. There is a time limit to file a claim for compensation. 7. How can I learn more?  Ask your healthcare provider. He or she can give you the vaccine package insert or suggest other sources of information.  Call your local or state health department.  Contact the Centers for Disease Control and Prevention (CDC):  Call 1-800-232-4636 (1-800-CDC-INFO) or  Visit CDC's website at www.cdc.gov/flu Vaccine Information Statement Inactivated Influenza Vaccine (11/24/2013)   This information is not intended to replace advice given to you by your health care provider. Make sure you discuss any questions you have with   your health care provider.   Document Released: 01/29/2006 Document Revised: 04/27/2014 Document Reviewed: 11/27/2013 Elsevier Interactive Patient Education 2016 Elsevier Inc.  Insomnia Insomnia is a sleep disorder that makes it difficult to fall asleep or to stay asleep. Insomnia can cause tiredness (fatigue), low energy, difficulty concentrating, mood swings, and poor performance at work or school.  There are three different ways to classify insomnia:  Difficulty falling asleep.  Difficulty staying asleep.  Waking up too early in the morning. Any type of insomnia can be long-term (chronic) or short-term (acute). Both are common. Short-term insomnia usually lasts for three months or less. Chronic insomnia occurs at least three times a week for longer than three  months. CAUSES  Insomnia may be caused by another condition, situation, or substance, such as:  Anxiety.  Certain medicines.  Gastroesophageal reflux disease (GERD) or other gastrointestinal conditions.  Asthma or other breathing conditions.  Restless legs syndrome, sleep apnea, or other sleep disorders.  Chronic pain.  Menopause. This may include hot flashes.  Stroke.  Abuse of alcohol, tobacco, or illegal drugs.  Depression.  Caffeine.   Neurological disorders, such as Alzheimer disease.  An overactive thyroid (hyperthyroidism). The cause of insomnia may not be known. RISK FACTORS Risk factors for insomnia include:  Gender. Women are more commonly affected than men.  Age. Insomnia is more common as you get older.  Stress. This may involve your professional or personal life.  Income. Insomnia is more common in people with lower income.  Lack of exercise.   Irregular work schedule or night shifts.  Traveling between different time zones. SIGNS AND SYMPTOMS If you have insomnia, trouble falling asleep or trouble staying asleep is the main symptom. This may lead to other symptoms, such as:  Feeling fatigued.  Feeling nervous about going to sleep.  Not feeling rested in the morning.  Having trouble concentrating.  Feeling irritable, anxious, or depressed. TREATMENT  Treatment for insomnia depends on the cause. If your insomnia is caused by an underlying condition, treatment will focus on addressing the condition. Treatment may also include:   Medicines to help you sleep.  Counseling or therapy.  Lifestyle adjustments. HOME CARE INSTRUCTIONS   Take medicines only as directed by your health care provider.  Keep regular sleeping and waking hours. Avoid naps.  Keep a sleep diary to help you and your health care provider figure out what could be causing your insomnia. Include:   When you sleep.  When you wake up during the night.  How well you  sleep.   How rested you feel the next day.  Any side effects of medicines you are taking.  What you eat and drink.   Make your bedroom a comfortable place where it is easy to fall asleep:  Put up shades or special blackout curtains to block light from outside.  Use a white noise machine to block noise.  Keep the temperature cool.   Exercise regularly as directed by your health care provider. Avoid exercising right before bedtime.  Use relaxation techniques to manage stress. Ask your health care provider to suggest some techniques that may work well for you. These may include:  Breathing exercises.  Routines to release muscle tension.  Visualizing peaceful scenes.  Cut back on alcohol, caffeinated beverages, and cigarettes, especially close to bedtime. These can disrupt your sleep.  Do not overeat or eat spicy foods right before bedtime. This can lead to digestive discomfort that can make it hard for you to sleep.  Limit screen use before bedtime. This includes:  Watching TV.  Using your smartphone, tablet, and computer.  Stick to a routine. This can help you fall asleep faster. Try to do a quiet activity, brush your teeth, and go to bed at the same time each night.  Get out of bed if you are still awake after 15 minutes of trying to sleep. Keep the lights down, but try reading or doing a quiet activity. When you feel sleepy, go back to bed.  Make sure that you drive carefully. Avoid driving if you feel very sleepy.  Keep all follow-up appointments as directed by your health care provider. This is important. SEEK MEDICAL CARE IF:   You are tired throughout the day or have trouble in your daily routine due to sleepiness.  You continue to have sleep problems or your sleep problems get worse. SEEK IMMEDIATE MEDICAL CARE IF:   You have serious thoughts about hurting yourself or someone else.   This information is not intended to replace advice given to you by your  health care provider. Make sure you discuss any questions you have with your health care provider.   Document Released: 04/03/2000 Document Revised: 12/26/2014 Document Reviewed: 01/05/2014 Elsevier Interactive Patient Education 2016 [employer]   Insomnia is a frustrating problem and fortunately, for most, it can be managed with lifestyle changes.  I  find the most effective strategies seem to be exercise, decreasing caffeine and screen time.    In addition to the above, studies have shown that cognitive behavioral therapies for sleep are more effective than medications.  There are some less expensive on-line programs for this that are a self-paced 6 week program.  Go to shuti.com(used by sleep labs) or https://www.haley-woods.biz/cbtforsleep.com.  There are others that are probably just as effective.    I take Magnesium Citrate before bed plus 1 mg or less of Melatonin.

## 2016-01-20 NOTE — Assessment & Plan Note (Signed)
Stable, continue current medications.  

## 2016-01-20 NOTE — Assessment & Plan Note (Addendum)
Stable, A1C 5.8 today, continue current medications. Patient is going to look into assistance programs regarding price of Victoza.

## 2016-01-20 NOTE — Assessment & Plan Note (Addendum)
Recommended exercise program.  Provided patient with resources about sleep hygiene. Prescribed Lorazepam as needed.

## 2016-01-21 ENCOUNTER — Encounter: Payer: Self-pay | Admitting: Unknown Physician Specialty

## 2016-01-21 LAB — COMPREHENSIVE METABOLIC PANEL
ALBUMIN: 4 g/dL (ref 3.5–4.8)
ALT: 13 IU/L (ref 0–32)
AST: 15 IU/L (ref 0–40)
Albumin/Globulin Ratio: 1.5 (ref 1.2–2.2)
Alkaline Phosphatase: 115 IU/L (ref 39–117)
BILIRUBIN TOTAL: 0.3 mg/dL (ref 0.0–1.2)
BUN / CREAT RATIO: 28 (ref 12–28)
BUN: 29 mg/dL — ABNORMAL HIGH (ref 8–27)
CALCIUM: 9.3 mg/dL (ref 8.7–10.3)
CHLORIDE: 99 mmol/L (ref 96–106)
CO2: 24 mmol/L (ref 18–29)
Creatinine, Ser: 1.02 mg/dL — ABNORMAL HIGH (ref 0.57–1.00)
GFR calc non Af Amer: 55 mL/min/{1.73_m2} — ABNORMAL LOW (ref >59)
GFR, EST AFRICAN AMERICAN: 64 mL/min/{1.73_m2} (ref >59)
GLUCOSE: 110 mg/dL — AB (ref 65–99)
Globulin, Total: 2.6 g/dL (ref 1.5–4.5)
Potassium: 4.4 mmol/L (ref 3.5–5.2)
SODIUM: 140 mmol/L (ref 134–144)
TOTAL PROTEIN: 6.6 g/dL (ref 6.0–8.5)

## 2016-02-05 ENCOUNTER — Other Ambulatory Visit: Payer: Self-pay | Admitting: Family Medicine

## 2016-02-05 NOTE — Telephone Encounter (Signed)
Routing to provider  

## 2016-02-14 DIAGNOSIS — H2513 Age-related nuclear cataract, bilateral: Secondary | ICD-10-CM | POA: Diagnosis not present

## 2016-02-14 LAB — HM DIABETES EYE EXAM

## 2016-02-21 ENCOUNTER — Other Ambulatory Visit: Payer: Self-pay | Admitting: Unknown Physician Specialty

## 2016-02-21 MED ORDER — EXENATIDE ER 2 MG ~~LOC~~ PEN: 2.0000 mg | PEN_INJECTOR | SUBCUTANEOUS | 12 refills | Status: DC

## 2016-02-21 NOTE — Progress Notes (Signed)
Patient returned call and was notified about medication. 

## 2016-02-21 NOTE — Progress Notes (Signed)
Called and left patient a VM asking for her to please return my call.  

## 2016-02-21 NOTE — Progress Notes (Signed)
Switched from [employer] to [employer] due to formulary issues and recommendations by pharmacist

## 2016-02-27 ENCOUNTER — Other Ambulatory Visit: Payer: Self-pay | Admitting: Family Medicine

## 2016-02-27 NOTE — Telephone Encounter (Signed)
Your patient 

## 2016-03-10 ENCOUNTER — Ambulatory Visit: Payer: Medicare Other | Admitting: Podiatry

## 2016-03-16 ENCOUNTER — Ambulatory Visit: Payer: Medicare Other | Admitting: Podiatry

## 2016-03-23 ENCOUNTER — Ambulatory Visit (INDEPENDENT_AMBULATORY_CARE_PROVIDER_SITE_OTHER): Payer: Medicare Other | Admitting: Podiatry

## 2016-03-23 ENCOUNTER — Encounter: Payer: Self-pay | Admitting: Podiatry

## 2016-03-23 VITALS — Ht 62.0 in | Wt 285.0 lb

## 2016-03-23 DIAGNOSIS — M79676 Pain in unspecified toe(s): Secondary | ICD-10-CM | POA: Diagnosis not present

## 2016-03-23 DIAGNOSIS — B351 Tinea unguium: Secondary | ICD-10-CM | POA: Diagnosis not present

## 2016-03-23 NOTE — Progress Notes (Signed)
Complaint:  Visit Type: Patient returns to my office for continued preventative foot care services. Complaint: Patient states" my nails have grown long and thick and become painful to walk and wear shoes" Patient has been diagnosed with DM with no foot complications. The patient presents for preventative foot care services. No changes to ROS  Podiatric Exam: Vascular: dorsalis pedis and posterior tibial pulses are palpable bilateral. Capillary return is immediate. Temperature gradient is WNL. Skin turgor WNL  Sensorium: Normal Semmes Weinstein monofilament test. Normal tactile sensation bilaterally. Nail Exam: Pt has thick disfigured discolored nails with subungual debris noted bilateral entire nail hallux through fifth toenails Ulcer Exam: There is no evidence of ulcer or pre-ulcerative changes or infection. Orthopedic Exam: Muscle tone and strength are WNL. No limitations in general ROM. No crepitus or effusions noted. Foot type and digits show no abnormalities. Bony prominences are unremarkable. Skin: No Porokeratosis. No infection or ulcers  Diagnosis:  Onychomycosis, , Pain in right toe, pain in left toes  Treatment & Plan Procedures and Treatment: Consent by patient was obtained for treatment procedures. The patient understood the discussion of treatment and procedures well. All questions were answered thoroughly reviewed. Debridement of mycotic and hypertrophic toenails, 1 through 5 bilateral and clearing of subungual debris. No ulceration, no infection noted.  Return Visit-Office Procedure: Patient instructed to return to the office for a follow up visit 3 months for continued evaluation and treatment.    Rosemarie Galvis DPM 

## 2016-04-21 ENCOUNTER — Encounter: Payer: Medicare Other | Admitting: Unknown Physician Specialty

## 2016-04-21 ENCOUNTER — Other Ambulatory Visit: Payer: Self-pay | Admitting: Unknown Physician Specialty

## 2016-04-21 NOTE — Telephone Encounter (Signed)
Routing to provider. Appt 06/17/16.

## 2016-05-26 ENCOUNTER — Other Ambulatory Visit: Payer: Self-pay | Admitting: Unknown Physician Specialty

## 2016-06-06 ENCOUNTER — Other Ambulatory Visit: Payer: Self-pay | Admitting: Unknown Physician Specialty

## 2016-06-17 ENCOUNTER — Encounter: Payer: Medicare Other | Admitting: Unknown Physician Specialty

## 2016-06-28 ENCOUNTER — Other Ambulatory Visit: Payer: Self-pay | Admitting: Unknown Physician Specialty

## 2016-07-06 ENCOUNTER — Ambulatory Visit (INDEPENDENT_AMBULATORY_CARE_PROVIDER_SITE_OTHER): Payer: Medicare Other | Admitting: Podiatry

## 2016-07-06 ENCOUNTER — Encounter: Payer: Self-pay | Admitting: Podiatry

## 2016-07-06 DIAGNOSIS — B351 Tinea unguium: Secondary | ICD-10-CM

## 2016-07-06 DIAGNOSIS — M79676 Pain in unspecified toe(s): Secondary | ICD-10-CM

## 2016-07-06 DIAGNOSIS — E119 Type 2 diabetes mellitus without complications: Secondary | ICD-10-CM

## 2016-07-06 NOTE — Progress Notes (Signed)
Complaint:  Visit Type: Patient returns to my office for continued preventative foot care services. Complaint: Patient states" my nails have grown long and thick and become painful to walk and wear shoes" Patient has been diagnosed with DM with no foot complications. The patient presents for preventative foot care services. No changes to ROS  Podiatric Exam: Vascular: dorsalis pedis and posterior tibial pulses are palpable bilateral. Capillary return is immediate. Temperature gradient is WNL. Skin turgor WNL  Sensorium: Normal Semmes Weinstein monofilament test. Normal tactile sensation bilaterally. Nail Exam: Pt has thick disfigured discolored nails with subungual debris noted bilateral entire nail hallux through fifth toenails Ulcer Exam: There is no evidence of ulcer or pre-ulcerative changes or infection. Orthopedic Exam: Muscle tone and strength are WNL. No limitations in general ROM. No crepitus or effusions noted. Foot type and digits show no abnormalities. Bony prominences are unremarkable. Skin: No Porokeratosis. No infection or ulcers  Diagnosis:  Onychomycosis, , Pain in right toe, pain in left toes  Treatment & Plan Procedures and Treatment: Consent by patient was obtained for treatment procedures. The patient understood the discussion of treatment and procedures well. All questions were answered thoroughly reviewed. Debridement of mycotic and hypertrophic toenails, 1 through 5 bilateral and clearing of subungual debris. No ulceration, no infection noted.  Return Visit-Office Procedure: Patient instructed to return to the office for a follow up visit 3 months for continued evaluation and treatment.    Saramarie Stinger DPM 

## 2016-07-16 ENCOUNTER — Other Ambulatory Visit: Payer: Self-pay | Admitting: Unknown Physician Specialty

## 2016-07-17 NOTE — Telephone Encounter (Signed)
Patient has an appointment scheduled 07/20/16.

## 2016-07-17 NOTE — Telephone Encounter (Signed)
Pt is overdue to be seen

## 2016-07-20 ENCOUNTER — Ambulatory Visit (INDEPENDENT_AMBULATORY_CARE_PROVIDER_SITE_OTHER): Payer: Medicare Other | Admitting: Unknown Physician Specialty

## 2016-07-20 ENCOUNTER — Encounter: Payer: Self-pay | Admitting: Unknown Physician Specialty

## 2016-07-20 VITALS — BP 133/78 | HR 71 | Temp 97.8°F | Ht 61.3 in | Wt 281.3 lb

## 2016-07-20 DIAGNOSIS — Z6835 Body mass index (BMI) 35.0-35.9, adult: Secondary | ICD-10-CM | POA: Diagnosis not present

## 2016-07-20 DIAGNOSIS — N183 Chronic kidney disease, stage 3 unspecified: Secondary | ICD-10-CM

## 2016-07-20 DIAGNOSIS — E1122 Type 2 diabetes mellitus with diabetic chronic kidney disease: Secondary | ICD-10-CM

## 2016-07-20 DIAGNOSIS — Z7189 Other specified counseling: Secondary | ICD-10-CM | POA: Insufficient documentation

## 2016-07-20 DIAGNOSIS — N182 Chronic kidney disease, stage 2 (mild): Secondary | ICD-10-CM | POA: Diagnosis not present

## 2016-07-20 DIAGNOSIS — Z0000 Encounter for general adult medical examination without abnormal findings: Secondary | ICD-10-CM

## 2016-07-20 DIAGNOSIS — I129 Hypertensive chronic kidney disease with stage 1 through stage 4 chronic kidney disease, or unspecified chronic kidney disease: Secondary | ICD-10-CM

## 2016-07-20 DIAGNOSIS — M109 Gout, unspecified: Secondary | ICD-10-CM | POA: Insufficient documentation

## 2016-07-20 DIAGNOSIS — E782 Mixed hyperlipidemia: Secondary | ICD-10-CM

## 2016-07-20 DIAGNOSIS — D649 Anemia, unspecified: Secondary | ICD-10-CM | POA: Diagnosis not present

## 2016-07-20 LAB — BAYER DCA HB A1C WAIVED: HB A1C (BAYER DCA - WAIVED): 5.8 % (ref <7.0)

## 2016-07-20 NOTE — Assessment & Plan Note (Signed)
Hgb A1C is 5.8.  Continue present meds

## 2016-07-20 NOTE — Assessment & Plan Note (Addendum)
A voluntary discussion about advance care planning including the explanation and discussion of advance directives was extensively discussed  with the patient.  Explanation about the health care proxy and Living will was reviewed and packet with forms with explanation of how to fill them out was given.  During this discussion, the patient was able to identify a health care proxy as her daughter who is an Charity fundraiser and plans/does not plan to fill out the paperwork required.  She did not want a living will as she wants her daughter to make those decisions.  Patient was offered a separate Advance Care Planning visit for further assistance with forms.

## 2016-07-20 NOTE — Assessment & Plan Note (Signed)
Check uric acid and consider allopurinol

## 2016-07-20 NOTE — Assessment & Plan Note (Signed)
Stable, continue present medications.   

## 2016-07-20 NOTE — Patient Instructions (Signed)
To help with insomnia:  [email or web address]

## 2016-07-20 NOTE — Assessment & Plan Note (Signed)
Discussed diet and exercise 

## 2016-07-20 NOTE — Progress Notes (Signed)
BP 133/78 (BP Location: Left Arm, Patient Position: Sitting, Cuff Size: Large)   Pulse 71   Temp 97.8 F (36.6 C)   Ht 5' 1.3" (1.557 m)   Wt 281 lb 4.8 oz (127.6 kg)   LMP  (LMP Unknown)   SpO2 96%   BMI 52.63 kg/m    Subjective:    Patient ID: Priscilla Johnson, female    DOB: 09/01/1945, 71 y.o.   MRN: [id number]  HPI: Priscilla Johnson is a 71 y.o. female  Chief Complaint  Patient presents with  . Medicare Wellness   Diabetes: Using medications without difficulties No hypoglycemic episodes No hyperglycemic episodes Feet problems:none Blood Sugars averaging:123-163 eye exam within last year Last Hgb A1C: 5.8  Hypertension  Using medications without difficulty Average home BPs Not checking  Using medication without problems or lightheadedness No chest pain with exertion or shortness of breath No Edema  Elevated Cholesterol Using medications without problems No Muscle aches  Diet: Lost some weight Exercise: Walking a little  Functional Status Survey: Is the patient deaf or have difficulty hearing?: No Does the patient have difficulty seeing, even when wearing glasses/contacts?: No Does the patient have difficulty concentrating, remembering, or making decisions?: No Does the patient have difficulty walking or climbing stairs?: Yes Does the patient have difficulty dressing or bathing?: No Does the patient have difficulty doing errands alone such as visiting a doctor's office or shopping?: No  Depression screen [facility] 2/9 07/20/2016 04/18/2015 01/16/2015  Decreased Interest 0 0 0  Down, Depressed, Hopeless 0 0 0  PHQ - 2 Score 0 0 0  Altered sleeping 3 - -  Tired, decreased energy 2 - -  Change in appetite 0 - -  Feeling bad or failure about yourself  0 - -  Trouble concentrating 0 - -  Moving slowly or fidgety/restless 0 - -  Suicidal thoughts 0 - -  PHQ-9 Score 5 - -   Fall Risk  07/20/2016 04/18/2015 01/16/2015  Falls in the past year? No No No    Mini  cog is negative.    Relevant past medical, surgical, family and social history reviewed and updated as indicated. Interim medical history since our last visit reviewed. Allergies and medications reviewed and updated.  Review of Systems  Constitutional: Negative.   HENT: Negative.   Eyes: Negative.   Respiratory: Negative.   Cardiovascular: Negative.   Gastrointestinal: Negative.   Endocrine: Negative.   Genitourinary: Negative.   Musculoskeletal:       Gout in big toe and in knee   Skin: Negative.   Allergic/Immunologic: Negative.   Neurological: Negative.   Hematological: Negative.   Psychiatric/Behavioral: Negative.     Per HPI unless specifically indicated above     Objective:    BP 133/78 (BP Location: Left Arm, Patient Position: Sitting, Cuff Size: Large)   Pulse 71   Temp 97.8 F (36.6 C)   Ht 5' 1.3" (1.557 m)   Wt 281 lb 4.8 oz (127.6 kg)   LMP  (LMP Unknown)   SpO2 96%   BMI 52.63 kg/m   Wt Readings from Last 3 Encounters:  07/20/16 281 lb 4.8 oz (127.6 kg)  03/23/16 285 lb (129.3 kg)  01/20/16 285 lb 12.8 oz (129.6 kg)    Physical Exam  Constitutional: She is oriented to person, place, and time. She appears well-developed and well-nourished.  HENT:  Head: Normocephalic and atraumatic.  Eyes: Pupils are equal, round, and reactive to light. Right eye exhibits no  discharge. Left eye exhibits no discharge. No scleral icterus.  Neck: Normal range of motion. Neck supple. Carotid bruit is not present. No thyromegaly present.  Cardiovascular: Normal rate, regular rhythm and normal heart sounds.  Exam reveals no gallop and no friction rub.   No murmur heard. Pulmonary/Chest: Effort normal and breath sounds normal. No respiratory distress. She has no wheezes. She has no rales.  Abdominal: Soft. Bowel sounds are normal. There is no tenderness. There is no rebound.  Genitourinary: No breast swelling, tenderness or discharge.  Musculoskeletal: Normal range of  motion.  Left large toe is tender  Lymphadenopathy:    She has no cervical adenopathy.  Neurological: She is alert and oriented to person, place, and time.  Skin: Skin is warm, dry and intact. No rash noted.  Psychiatric: She has a normal mood and affect. Her speech is normal and behavior is normal. Judgment and thought content normal. Cognition and memory are normal.    Results for orders placed or performed in visit on 02/21/16  HM DIABETES EYE EXAM  Result Value Ref Range   HM Diabetic Eye Exam No Retinopathy No Retinopathy      Assessment & Plan:   Problem List Items Addressed This Visit      Unprioritized   Advanced care planning/counseling discussion    A voluntary discussion about advance care planning including the explanation and discussion of advance directives was extensively discussed  with the patient.  Explanation about the health care proxy and Living will was reviewed and packet with forms with explanation of how to fill them out was given.  During this discussion, the patient was able to identify a health care proxy as her daughter who is an Charity fundraiser and plans/does not plan to fill out the paperwork required.  She did not want a living will as she wants her daughter to make those decisions.  Patient was offered a separate Advance Care Planning visit for further assistance with forms.         CKD (chronic kidney disease), stage III   Relevant Orders   Comprehensive metabolic panel   CBC with Differential/Platelet   Gout    Check uric acid and consider allopurinol      Relevant Orders   Uric acid   Hyperlipidemia   Relevant Orders   Lipid Panel w/o Chol/HDL Ratio   Hypertensive CKD (chronic kidney disease)    Stable, continue present medications.        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  Continue present meds      Relevant Orders   Bayer DCA Hb A1c Waived    Other Visit Diagnoses    Annual physical exam       Refusing Mammogram, c\bone density and colonoscopy and cologuard       Follow up plan: Return in about 6 months (around 01/19/2017).

## 2016-07-21 ENCOUNTER — Other Ambulatory Visit: Payer: Self-pay | Admitting: Unknown Physician Specialty

## 2016-07-21 DIAGNOSIS — D649 Anemia, unspecified: Secondary | ICD-10-CM | POA: Insufficient documentation

## 2016-07-21 DIAGNOSIS — D631 Anemia in chronic kidney disease: Secondary | ICD-10-CM

## 2016-07-21 DIAGNOSIS — N189 Chronic kidney disease, unspecified: Principal | ICD-10-CM

## 2016-07-21 LAB — CBC WITH DIFFERENTIAL/PLATELET
Basophils Absolute: 0 10*3/uL (ref 0.0–0.2)
Basos: 0 %
EOS (ABSOLUTE): 0.3 10*3/uL (ref 0.0–0.4)
EOS: 4 %
HEMATOCRIT: 32.9 % — AB (ref 34.0–46.6)
HEMOGLOBIN: 10.5 g/dL — AB (ref 11.1–15.9)
IMMATURE GRANS (ABS): 0 10*3/uL (ref 0.0–0.1)
Immature Granulocytes: 0 %
LYMPHS ABS: 1.5 10*3/uL (ref 0.7–3.1)
Lymphs: 19 %
MCH: 26.6 pg (ref 26.6–33.0)
MCHC: 31.9 g/dL (ref 31.5–35.7)
MCV: 83 fL (ref 79–97)
Monocytes Absolute: 0.5 10*3/uL (ref 0.1–0.9)
Monocytes: 6 %
NEUTROS ABS: 5.4 10*3/uL (ref 1.4–7.0)
NEUTROS PCT: 71 %
Platelets: 281 10*3/uL (ref 150–379)
RBC: 3.95 x10E6/uL (ref 3.77–5.28)
RDW: 15.9 % — ABNORMAL HIGH (ref 12.3–15.4)
WBC: 7.8 10*3/uL (ref 3.4–10.8)

## 2016-07-21 LAB — COMPREHENSIVE METABOLIC PANEL
ALT: 14 IU/L (ref 0–32)
AST: 14 IU/L (ref 0–40)
Albumin/Globulin Ratio: 1.3 (ref 1.2–2.2)
Albumin: 3.8 g/dL (ref 3.5–4.8)
Alkaline Phosphatase: 123 IU/L — ABNORMAL HIGH (ref 39–117)
BUN/Creatinine Ratio: 25 (ref 12–28)
BUN: 30 mg/dL — ABNORMAL HIGH (ref 8–27)
Bilirubin Total: 0.2 mg/dL (ref 0.0–1.2)
CO2: 24 mmol/L (ref 18–29)
Calcium: 9.4 mg/dL (ref 8.7–10.3)
Chloride: 99 mmol/L (ref 96–106)
Creatinine, Ser: 1.2 mg/dL — ABNORMAL HIGH (ref 0.57–1.00)
GFR calc Af Amer: 52 mL/min/{1.73_m2} — ABNORMAL LOW (ref >59)
GFR calc non Af Amer: 45 mL/min/{1.73_m2} — ABNORMAL LOW (ref >59)
Globulin, Total: 2.9 g/dL (ref 1.5–4.5)
Glucose: 105 mg/dL — ABNORMAL HIGH (ref 65–99)
Potassium: 5.7 mmol/L — ABNORMAL HIGH (ref 3.5–5.2)
Sodium: 142 mmol/L (ref 134–144)
Total Protein: 6.7 g/dL (ref 6.0–8.5)

## 2016-07-21 LAB — LIPID PANEL W/O CHOL/HDL RATIO
Cholesterol, Total: 208 mg/dL — ABNORMAL HIGH (ref 100–199)
HDL: 52 mg/dL (ref >39)
LDL Calculated: 131 mg/dL — ABNORMAL HIGH (ref 0–99)
TRIGLYCERIDES: 127 mg/dL (ref 0–149)
VLDL Cholesterol Cal: 25 mg/dL (ref 5–40)

## 2016-07-24 ENCOUNTER — Other Ambulatory Visit: Payer: Self-pay | Admitting: Unknown Physician Specialty

## 2016-07-24 ENCOUNTER — Telehealth: Payer: Self-pay | Admitting: Unknown Physician Specialty

## 2016-07-24 DIAGNOSIS — N183 Chronic kidney disease, stage 3 unspecified: Secondary | ICD-10-CM

## 2016-07-24 DIAGNOSIS — E538 Deficiency of other specified B group vitamins: Secondary | ICD-10-CM

## 2016-07-24 MED ORDER — CYANOCOBALAMIN 1000 MCG/ML IJ SOLN
1000.0000 ug | INTRAMUSCULAR | Status: AC
  Administered 2016-07-28 – 2016-09-08 (×2): 1000 ug via INTRAMUSCULAR

## 2016-07-24 NOTE — Telephone Encounter (Signed)
Discussed with pt Vit B12 deficient.  This has been a problem before.  Start monthly injections.    Recheck GFR next visit.  Consider referral to nephrology

## 2016-07-26 ENCOUNTER — Other Ambulatory Visit: Payer: Self-pay | Admitting: Unknown Physician Specialty

## 2016-07-27 ENCOUNTER — Telehealth: Payer: Self-pay | Admitting: Unknown Physician Specialty

## 2016-07-27 DIAGNOSIS — M109 Gout, unspecified: Secondary | ICD-10-CM

## 2016-07-27 LAB — SPECIMEN STATUS REPORT

## 2016-07-27 LAB — URIC ACID: URIC ACID: 12 mg/dL — AB (ref 2.5–7.1)

## 2016-07-27 NOTE — Telephone Encounter (Signed)
Routing to provider  

## 2016-07-27 NOTE — Telephone Encounter (Signed)
Per patient it is okay to go ahead and order the script for the uric acid.  Clydie Braun

## 2016-07-28 ENCOUNTER — Ambulatory Visit (INDEPENDENT_AMBULATORY_CARE_PROVIDER_SITE_OTHER): Payer: Medicare Other

## 2016-07-28 ENCOUNTER — Encounter: Payer: Self-pay | Admitting: Unknown Physician Specialty

## 2016-07-28 ENCOUNTER — Other Ambulatory Visit: Payer: Self-pay | Admitting: Unknown Physician Specialty

## 2016-07-28 DIAGNOSIS — E538 Deficiency of other specified B group vitamins: Secondary | ICD-10-CM | POA: Diagnosis not present

## 2016-07-28 LAB — B12 AND FOLATE PANEL
FOLATE: 7.9 ng/mL (ref >3.0)
VITAMIN B 12: 223 pg/mL — AB (ref 232–1245)

## 2016-07-28 LAB — IRON AND TIBC
Iron Saturation: 10 % — ABNORMAL LOW (ref 15–55)
Iron: 38 ug/dL (ref 27–139)
Total Iron Binding Capacity: 389 ug/dL (ref 250–450)
UIBC: 351 ug/dL (ref 118–369)

## 2016-07-28 LAB — SPECIMEN STATUS REPORT

## 2016-07-28 LAB — FERRITIN: FERRITIN: 30 ng/mL (ref 15–150)

## 2016-07-28 MED ORDER — ALLOPURINOL 100 MG PO TABS: 100.0000 mg | ORAL_TABLET | Freq: Every day | ORAL | 3 refills | Status: DC

## 2016-07-28 NOTE — Progress Notes (Signed)
Notified pt by mychart

## 2016-07-28 NOTE — Telephone Encounter (Signed)
This was just refilled and faxed to the pharmacy.

## 2016-07-28 NOTE — Progress Notes (Signed)
Normal labs.  Pt notified through mychart

## 2016-07-28 NOTE — Telephone Encounter (Signed)
Start Allopurinol 

## 2016-09-05 ENCOUNTER — Other Ambulatory Visit: Payer: Self-pay | Admitting: Unknown Physician Specialty

## 2016-09-08 ENCOUNTER — Ambulatory Visit (INDEPENDENT_AMBULATORY_CARE_PROVIDER_SITE_OTHER): Payer: Medicare Other

## 2016-09-08 DIAGNOSIS — E538 Deficiency of other specified B group vitamins: Secondary | ICD-10-CM | POA: Diagnosis not present

## 2016-09-17 ENCOUNTER — Encounter: Payer: Self-pay | Admitting: Unknown Physician Specialty

## 2016-09-18 MED ORDER — SITAGLIPTIN PHOSPHATE 100 MG PO TABS: 100.0000 mg | ORAL_TABLET | Freq: Every day | ORAL | 3 refills | Status: DC

## 2016-09-27 ENCOUNTER — Other Ambulatory Visit: Payer: Self-pay | Admitting: Unknown Physician Specialty

## 2016-10-02 NOTE — Telephone Encounter (Signed)
OK, I think I put the order in

## 2016-10-12 ENCOUNTER — Ambulatory Visit (INDEPENDENT_AMBULATORY_CARE_PROVIDER_SITE_OTHER): Payer: Medicare Other

## 2016-10-12 ENCOUNTER — Encounter: Payer: Self-pay | Admitting: Podiatry

## 2016-10-12 ENCOUNTER — Ambulatory Visit (INDEPENDENT_AMBULATORY_CARE_PROVIDER_SITE_OTHER): Payer: Medicare Other | Admitting: Podiatry

## 2016-10-12 DIAGNOSIS — R52 Pain, unspecified: Secondary | ICD-10-CM

## 2016-10-12 DIAGNOSIS — E119 Type 2 diabetes mellitus without complications: Secondary | ICD-10-CM

## 2016-10-12 DIAGNOSIS — M79676 Pain in unspecified toe(s): Secondary | ICD-10-CM

## 2016-10-12 DIAGNOSIS — B351 Tinea unguium: Secondary | ICD-10-CM

## 2016-10-12 DIAGNOSIS — S93401A Sprain of unspecified ligament of right ankle, initial encounter: Secondary | ICD-10-CM

## 2016-10-12 NOTE — Progress Notes (Signed)
Complaint:  Visit Type: Patient returns to my office for continued preventative foot care services. Complaint: Patient states" my nails have grown long and thick and become painful to walk and wear shoes" Patient has been diagnosed with DM with no foot complications. The patient presents for preventative foot care services. No changes to ROS.  Patient also relates that she fell as she was walking up her stairs one week ago. She says she injured her right ankle during her fall.  She says she is now having pain in her right ankle as she walks. She says she has provided no self treatment nor sought any professional help. She presents the office today for evaluation of her right ankle as well as preventative foot care services  Podiatric Exam: Vascular: dorsalis pedis and posterior tibial pulses are palpable bilateral. Capillary return is immediate. Temperature gradient is WNL. Skin turgor WNL  Sensorium: Normal Semmes Weinstein monofilament test. Normal tactile sensation bilaterally. Nail Exam: Pt has thick disfigured discolored nails with subungual debris noted bilateral entire nail hallux through fifth toenails Ulcer Exam: There is no evidence of ulcer or pre-ulcerative changes or infection. Orthopedic Exam: Muscle tone and strength are WNL. No limitations in general ROM. No crepitus or effusions noted. Foot type and digits show no abnormalities. Bony prominences are unremarkable. There is generalized swelling noted on the lateral aspect of the right ankle.  There is palpable pain noted at the posterior aspect of the lateral malleolus, right ankle.  There is absence of muscle power in the peroneal tendon of the right ankle.   Skin: No Porokeratosis. No infection or ulcers  Diagnosis:  Onychomycosis, , Pain in right toe, pain in left toes,  Ankle sprain right.  Treatment & Plan Procedures and Treatment: Consent by patient was obtained for treatment procedures. The patient understood the discussion of  treatment and procedures well. All questions were answered thoroughly reviewed. Debridement of mycotic and hypertrophic toenails, 1 through 5 bilateral and clearing of subungual debris. No ulceration, no infection noted. Discussed the ankle sprain with this patient.  We discussed her ankle sprain and she believes there is no evidence of any broken bones. She believes she has  a tendinitis in her right ankle  There is no evidence of ecchymosis or pain to the lateral malleolus.  I discussed treatment and we decided to apply an Ace bandage and have her walk in a Cam Walker, which she has at home.  If this muscle weakness and pain continues. She should then make an appointment with one of the  podiatrists for an evaluation of her right ankle.,  Possible MRI will be considered.   Return Visit-Office Procedure: Patient instructed to return to the office for a follow up visit 3 months for continued evaluation and treatment.    Helane GuntherGregory Mardelle Pandolfi DPM

## 2016-10-17 ENCOUNTER — Other Ambulatory Visit: Payer: Self-pay | Admitting: Unknown Physician Specialty

## 2016-10-26 ENCOUNTER — Other Ambulatory Visit: Payer: Self-pay | Admitting: Unknown Physician Specialty

## 2016-12-01 ENCOUNTER — Other Ambulatory Visit: Payer: Self-pay | Admitting: Family Medicine

## 2016-12-25 ENCOUNTER — Other Ambulatory Visit: Payer: Self-pay | Admitting: Unknown Physician Specialty

## 2016-12-27 ENCOUNTER — Other Ambulatory Visit: Payer: Self-pay | Admitting: Unknown Physician Specialty

## 2017-01-11 ENCOUNTER — Other Ambulatory Visit: Payer: Self-pay | Admitting: Unknown Physician Specialty

## 2017-01-14 ENCOUNTER — Ambulatory Visit: Payer: Medicare Other | Admitting: Podiatry

## 2017-01-14 ENCOUNTER — Other Ambulatory Visit: Payer: Self-pay | Admitting: Family Medicine

## 2017-01-14 NOTE — Telephone Encounter (Signed)
rx

## 2017-01-20 ENCOUNTER — Ambulatory Visit: Payer: Medicare Other | Admitting: Unknown Physician Specialty

## 2017-01-26 ENCOUNTER — Other Ambulatory Visit: Payer: Self-pay | Admitting: Unknown Physician Specialty

## 2017-01-26 ENCOUNTER — Ambulatory Visit: Payer: Medicare Other | Admitting: Unknown Physician Specialty

## 2017-01-28 ENCOUNTER — Ambulatory Visit: Payer: Medicare Other | Admitting: Podiatry

## 2017-02-01 ENCOUNTER — Ambulatory Visit (INDEPENDENT_AMBULATORY_CARE_PROVIDER_SITE_OTHER): Payer: Medicare Other | Admitting: Podiatry

## 2017-02-01 DIAGNOSIS — B351 Tinea unguium: Secondary | ICD-10-CM | POA: Diagnosis not present

## 2017-02-01 DIAGNOSIS — M79676 Pain in unspecified toe(s): Secondary | ICD-10-CM

## 2017-02-01 DIAGNOSIS — E119 Type 2 diabetes mellitus without complications: Secondary | ICD-10-CM

## 2017-02-01 NOTE — Progress Notes (Signed)
Complaint:  Visit Type: Patient returns to my office for continued preventative foot care services. Complaint: Patient states" my nails have grown long and thick and become painful to walk and wear shoes" Patient has been diagnosed with DM with no foot complications. The patient presents for preventative foot care services. No changes to ROS.  Patient says her ankle is healed.  Podiatric Exam: Vascular: dorsalis pedis and posterior tibial pulses are palpable bilateral. Capillary return is immediate. Temperature gradient is WNL. Skin turgor WNL  Sensorium: Normal Semmes Weinstein monofilament test. Normal tactile sensation bilaterally. Nail Exam: Pt has thick disfigured discolored nails with subungual debris noted bilateral entire nail hallux through fifth toenails Ulcer Exam: There is no evidence of ulcer or pre-ulcerative changes or infection. Orthopedic Exam: Muscle tone and strength are WNL. No limitations in general ROM. No crepitus or effusions noted. Foot type and digits show no abnormalities. Bony prominences are unremarkable. Skin: No Porokeratosis. No infection or ulcers  Diagnosis:  Onychomycosis, , Pain in right toe, pain in left toes  Treatment & Plan Procedures and Treatment: Consent by patient was obtained for treatment procedures. The patient understood the discussion of treatment and procedures well. All questions were answered thoroughly reviewed. Debridement of mycotic and hypertrophic toenails, 1 through 5 bilateral and clearing of subungual debris. No ulceration, no infection noted.  Return Visit-Office Procedure: Patient instructed to return to the office for a follow up visit 3 months for continued evaluation and treatment.    Helane Gunther DPM

## 2017-02-10 ENCOUNTER — Ambulatory Visit (INDEPENDENT_AMBULATORY_CARE_PROVIDER_SITE_OTHER): Payer: Medicare Other | Admitting: Unknown Physician Specialty

## 2017-02-10 ENCOUNTER — Encounter: Payer: Self-pay | Admitting: Unknown Physician Specialty

## 2017-02-10 VITALS — BP 137/58 | HR 66 | Temp 97.7°F | Ht 61.3 in | Wt 277.6 lb

## 2017-02-10 DIAGNOSIS — Z23 Encounter for immunization: Secondary | ICD-10-CM

## 2017-02-10 DIAGNOSIS — D631 Anemia in chronic kidney disease: Secondary | ICD-10-CM | POA: Diagnosis not present

## 2017-02-10 DIAGNOSIS — N189 Chronic kidney disease, unspecified: Secondary | ICD-10-CM | POA: Diagnosis not present

## 2017-02-10 DIAGNOSIS — E782 Mixed hyperlipidemia: Secondary | ICD-10-CM | POA: Diagnosis not present

## 2017-02-10 DIAGNOSIS — E1122 Type 2 diabetes mellitus with diabetic chronic kidney disease: Secondary | ICD-10-CM | POA: Diagnosis not present

## 2017-02-10 DIAGNOSIS — N182 Chronic kidney disease, stage 2 (mild): Secondary | ICD-10-CM | POA: Diagnosis not present

## 2017-02-10 DIAGNOSIS — Z6835 Body mass index (BMI) 35.0-35.9, adult: Secondary | ICD-10-CM | POA: Diagnosis not present

## 2017-02-10 DIAGNOSIS — D649 Anemia, unspecified: Secondary | ICD-10-CM | POA: Diagnosis not present

## 2017-02-10 DIAGNOSIS — I10 Essential (primary) hypertension: Secondary | ICD-10-CM

## 2017-02-10 LAB — BAYER DCA HB A1C WAIVED: HB A1C (BAYER DCA - WAIVED): 5.5 % (ref <7.0)

## 2017-02-10 MED ORDER — LISINOPRIL-HYDROCHLOROTHIAZIDE 20-25 MG PO TABS: 1.0000 | ORAL_TABLET | Freq: Every day | ORAL | 3 refills | Status: DC

## 2017-02-10 NOTE — Assessment & Plan Note (Signed)
Stable, continue present medications.   

## 2017-02-10 NOTE — Assessment & Plan Note (Signed)
Non-specific.  Check labs today

## 2017-02-10 NOTE — Progress Notes (Signed)
BP (!) 137/58   Pulse 66   Temp 97.7 F (36.5 C)   Ht 5' 1.3" (1.557 m)   Wt 277 lb 9.6 oz (125.9 kg)   LMP  (LMP Unknown)   SpO2 93%   BMI 51.94 kg/m    Subjective:    Patient ID: Priscilla Johnson, female    DOB: 1946-01-16, 71 y.o.   MRN: [id number]  HPI: Priscilla Johnson is a 71 y.o. female  Chief Complaint  Patient presents with  . Diabetes  . Hyperlipidemia  . Hypertension   Diabetes: Using medications without difficulties.  Changed to Januvia last visit No hypoglycemic episodes No hyperglycemic episodes Feet problems: none Blood Sugars averaging: Not checking eye exam within last year Last Hgb A1C: 5.8  Hypertension  Using medications without difficulty Average home BPs   Using medication without problems or lightheadedness No chest pain with exertion or shortness of breath No Edema  Elevated Cholesterol Using medications without problems No Muscle aches  Diet: Needs to work on this Exercise:Not walking due to knee and hip pain  Anemia Low H/H.  Anemia labs normal except a low normal B12.  Started monthly B12 injections  Sinus drainage Clear.  Wonders what she can take   Relevant past medical, surgical, family and social history reviewed and updated as indicated. Interim medical history since our last visit reviewed. Allergies and medications reviewed and updated.  Review of Systems  Per HPI unless specifically indicated above     Objective:    BP (!) 137/58   Pulse 66   Temp 97.7 F (36.5 C)   Ht 5' 1.3" (1.557 m)   Wt 277 lb 9.6 oz (125.9 kg)   LMP  (LMP Unknown)   SpO2 93%   BMI 51.94 kg/m   Wt Readings from Last 3 Encounters:  02/10/17 277 lb 9.6 oz (125.9 kg)  07/20/16 281 lb 4.8 oz (127.6 kg)  03/23/16 285 lb (129.3 kg)    Physical Exam  Constitutional: She is oriented to person, place, and time. She appears well-developed and well-nourished. No distress.  HENT:  Head: Normocephalic and atraumatic.  Eyes: Conjunctivae  and lids are normal. Right eye exhibits no discharge. Left eye exhibits no discharge. No scleral icterus.  Neck: Normal range of motion. Neck supple. No JVD present. Carotid bruit is not present.  Cardiovascular: Normal rate, regular rhythm and normal heart sounds.   Pulmonary/Chest: Effort normal and breath sounds normal.  Abdominal: Normal appearance. There is no splenomegaly or hepatomegaly.  Musculoskeletal: Normal range of motion.  Neurological: She is alert and oriented to person, place, and time.  Skin: Skin is warm, dry and intact. No rash noted. No pallor.  Psychiatric: She has a normal mood and affect. Her behavior is normal. Judgment and thought content normal.    Results for orders placed or performed in visit on 07/20/16  Comprehensive metabolic panel  Result Value Ref Range   Glucose 105 (H) 65 - 99 mg/dL   BUN 30 (H) 8 - 27 mg/dL   Creatinine, Ser 4.09 (H) 0.57 - 1.00 mg/dL   GFR calc non Af Amer 45 (L) >59 mL/min/1.73   GFR calc Af Amer 52 (L) >59 mL/min/1.73   BUN/Creatinine Ratio 25 12 - 28   Sodium 142 134 - 144 mmol/L   Potassium 5.7 (H) 3.5 - 5.2 mmol/L   Chloride 99 96 - 106 mmol/L   CO2 24 18 - 29 mmol/L   Calcium 9.4 8.7 - 10.3 mg/dL  Total Protein 6.7 6.0 - 8.5 g/dL   Albumin 3.8 3.5 - 4.8 g/dL   Globulin, Total 2.9 1.5 - 4.5 g/dL   Albumin/Globulin Ratio 1.3 1.2 - 2.2   Bilirubin Total 0.2 0.0 - 1.2 mg/dL   Alkaline Phosphatase 123 (H) 39 - 117 IU/L   AST 14 0 - 40 IU/L   ALT 14 0 - 32 IU/L  Lipid Panel w/o Chol/HDL Ratio  Result Value Ref Range   Cholesterol, Total 208 (H) 100 - 199 mg/dL   Triglycerides 098 0 - 149 mg/dL   HDL 52 >11 mg/dL   VLDL Cholesterol Cal 25 5 - 40 mg/dL   LDL Calculated 914 (H) 0 - 99 mg/dL  Bayer DCA Hb N8G Waived  Result Value Ref Range   Bayer DCA Hb A1c Waived 5.8 <7.0 %  CBC with Differential/Platelet  Result Value Ref Range   WBC 7.8 3.4 - 10.8 x10E3/uL   RBC 3.95 3.77 - 5.28 x10E6/uL   Hemoglobin 10.5 (L) 11.1  - 15.9 g/dL   Hematocrit 95.6 (L) 21.3 - 46.6 %   MCV 83 79 - 97 fL   MCH 26.6 26.6 - 33.0 pg   MCHC 31.9 31.5 - 35.7 g/dL   RDW 08.6 (H) 57.8 - 46.9 %   Platelets 281 150 - 379 x10E3/uL   Neutrophils 71 Not Estab. %   Lymphs 19 Not Estab. %   Monocytes 6 Not Estab. %   Eos 4 Not Estab. %   Basos 0 Not Estab. %   Neutrophils Absolute 5.4 1.4 - 7.0 x10E3/uL   Lymphocytes Absolute 1.5 0.7 - 3.1 x10E3/uL   Monocytes Absolute 0.5 0.1 - 0.9 x10E3/uL   EOS (ABSOLUTE) 0.3 0.0 - 0.4 x10E3/uL   Basophils Absolute 0.0 0.0 - 0.2 x10E3/uL   Immature Granulocytes 0 Not Estab. %   Immature Grans (Abs) 0.0 0.0 - 0.1 x10E3/uL  Iron and TIBC  Result Value Ref Range   Total Iron Binding Capacity 389 250 - 450 ug/dL   UIBC 629 528 - 413 ug/dL   Iron 38 27 - 244 ug/dL   Iron Saturation 10 (L) 15 - 55 %  B12 and Folate Panel  Result Value Ref Range   Vitamin B-12 223 (L) 232 - 1,245 pg/mL   Folate 7.9 >3.0 ng/mL  Ferritin  Result Value Ref Range   Ferritin 30 15 - 150 ng/mL  Specimen status report  Result Value Ref Range   specimen status report Comment   Uric acid  Result Value Ref Range   Uric Acid 12.0 (H) 2.5 - 7.1 mg/dL  Specimen status report  Result Value Ref Range   specimen status report Comment       Assessment & Plan:   Problem List Items Addressed This Visit      Unprioritized   Anemia    Non-specific.  Check labs today      Essential hypertension    Stable, continue present medications.        Relevant Medications   lisinopril-hydrochlorothiazide (PRINZIDE,ZESTORETIC) 20-25 MG tablet   Other Relevant Orders   Comprehensive metabolic panel   Hyperlipidemia    Stable, continue present medications.        Relevant Medications   lisinopril-hydrochlorothiazide (PRINZIDE,ZESTORETIC) 20-25 MG tablet   Other Relevant Orders   Lipid Panel w/o Chol/HDL Ratio   Severe obesity (BMI 35.0-35.9 with comorbidity) (HCC)    Has lost several pounds      Type II diabetes  mellitus  with renal manifestations (HCC)    hgb A1C is 5.5%.  DC Januvia for excellent control.  Monitor GFR for Metformin use      Relevant Medications   lisinopril-hydrochlorothiazide (PRINZIDE,ZESTORETIC) 20-25 MG tablet   Other Relevant Orders   Comprehensive metabolic panel   Bayer DCA Hb U2VA1c Waived    Other Visit Diagnoses    Need for influenza vaccination    -  Primary   Relevant Orders   Flu vaccine HIGH DOSE PF (Completed)   Anemia in chronic kidney disease, unspecified CKD stage       Relevant Orders   CBC with Differential/Platelet       Follow up plan: Return in about 6 months (around 08/11/2017).

## 2017-02-10 NOTE — Assessment & Plan Note (Signed)
Has lost several pounds

## 2017-02-10 NOTE — Patient Instructions (Signed)

## 2017-02-10 NOTE — Assessment & Plan Note (Addendum)
hgb A1C is 5.5%.  DC Januvia for excellent control.  Monitor GFR for Metformin use

## 2017-02-11 LAB — CBC WITH DIFFERENTIAL/PLATELET
BASOS ABS: 0 10*3/uL (ref 0.0–0.2)
Basos: 0 %
EOS (ABSOLUTE): 0.1 10*3/uL (ref 0.0–0.4)
EOS: 2 %
HEMATOCRIT: 33 % — AB (ref 34.0–46.6)
Hemoglobin: 10 g/dL — ABNORMAL LOW (ref 11.1–15.9)
IMMATURE GRANULOCYTES: 0 %
Immature Grans (Abs): 0 10*3/uL (ref 0.0–0.1)
Lymphocytes Absolute: 1.1 10*3/uL (ref 0.7–3.1)
Lymphs: 14 %
MCH: 25.6 pg — ABNORMAL LOW (ref 26.6–33.0)
MCHC: 30.3 g/dL — AB (ref 31.5–35.7)
MCV: 84 fL (ref 79–97)
MONOS ABS: 0.7 10*3/uL (ref 0.1–0.9)
Monocytes: 9 %
NEUTROS PCT: 75 %
Neutrophils Absolute: 5.9 10*3/uL (ref 1.4–7.0)
PLATELETS: 279 10*3/uL (ref 150–379)
RBC: 3.91 x10E6/uL (ref 3.77–5.28)
RDW: 16.5 % — AB (ref 12.3–15.4)
WBC: 7.8 10*3/uL (ref 3.4–10.8)

## 2017-02-11 LAB — COMPREHENSIVE METABOLIC PANEL
A/G RATIO: 1.5 (ref 1.2–2.2)
ALT: 9 IU/L (ref 0–32)
AST: 11 IU/L (ref 0–40)
Albumin: 3.9 g/dL (ref 3.5–4.8)
Alkaline Phosphatase: 113 IU/L (ref 39–117)
BUN/Creatinine Ratio: 26 (ref 12–28)
BUN: 26 mg/dL (ref 8–27)
Bilirubin Total: 0.3 mg/dL (ref 0.0–1.2)
CALCIUM: 9.2 mg/dL (ref 8.7–10.3)
CO2: 29 mmol/L (ref 20–29)
CREATININE: 1.01 mg/dL — AB (ref 0.57–1.00)
Chloride: 100 mmol/L (ref 96–106)
GFR, EST AFRICAN AMERICAN: 64 mL/min/{1.73_m2} (ref >59)
GFR, EST NON AFRICAN AMERICAN: 56 mL/min/{1.73_m2} — AB (ref >59)
GLOBULIN, TOTAL: 2.6 g/dL (ref 1.5–4.5)
Glucose: 81 mg/dL (ref 65–99)
POTASSIUM: 4.9 mmol/L (ref 3.5–5.2)
Sodium: 140 mmol/L (ref 134–144)
TOTAL PROTEIN: 6.5 g/dL (ref 6.0–8.5)

## 2017-02-11 LAB — IRON AND TIBC
IRON SATURATION: 10 % — AB (ref 15–55)
IRON: 36 ug/dL (ref 27–139)
Total Iron Binding Capacity: 363 ug/dL (ref 250–450)
UIBC: 327 ug/dL (ref 118–369)

## 2017-02-11 LAB — LIPID PANEL W/O CHOL/HDL RATIO
Cholesterol, Total: 150 mg/dL (ref 100–199)
HDL: 53 mg/dL (ref >39)
LDL CALC: 77 mg/dL (ref 0–99)
Triglycerides: 101 mg/dL (ref 0–149)
VLDL Cholesterol Cal: 20 mg/dL (ref 5–40)

## 2017-02-11 LAB — FERRITIN: FERRITIN: 30 ng/mL (ref 15–150)

## 2017-02-11 LAB — FOLATE: Folate: 11.9 ng/mL (ref >3.0)

## 2017-02-11 LAB — VITAMIN B12: Vitamin B-12: 775 pg/mL (ref 232–1245)

## 2017-02-11 NOTE — Progress Notes (Signed)
Pt notified through mychart.

## 2017-03-26 ENCOUNTER — Other Ambulatory Visit: Payer: Self-pay | Admitting: Family Medicine

## 2017-03-26 NOTE — Telephone Encounter (Signed)
Your patient 

## 2017-04-09 ENCOUNTER — Other Ambulatory Visit: Payer: Self-pay | Admitting: Unknown Physician Specialty

## 2017-04-09 MED ORDER — ATORVASTATIN CALCIUM 40 MG PO TABS: 40.0000 mg | ORAL_TABLET | Freq: Every day | ORAL | 3 refills | Status: DC

## 2017-04-18 ENCOUNTER — Other Ambulatory Visit: Payer: Self-pay | Admitting: Unknown Physician Specialty

## 2017-04-23 ENCOUNTER — Other Ambulatory Visit: Payer: Self-pay | Admitting: Unknown Physician Specialty

## 2017-05-04 ENCOUNTER — Other Ambulatory Visit: Payer: Self-pay

## 2017-05-04 MED ORDER — AMLODIPINE BESYLATE 10 MG PO TABS: 10.0000 mg | ORAL_TABLET | Freq: Every day | ORAL | 1 refills | Status: DC

## 2017-05-04 MED ORDER — PIOGLITAZONE HCL 30 MG PO TABS: 30.0000 mg | ORAL_TABLET | Freq: Every day | ORAL | 1 refills | Status: DC

## 2017-05-04 MED ORDER — GLIPIZIDE 5 MG PO TABS: 5.0000 mg | ORAL_TABLET | Freq: Two times a day (BID) | ORAL | 1 refills | Status: DC

## 2017-05-04 MED ORDER — METFORMIN HCL 500 MG PO TABS: 500.0000 mg | ORAL_TABLET | Freq: Two times a day (BID) | ORAL | 1 refills | Status: DC

## 2017-05-04 MED ORDER — ATORVASTATIN CALCIUM 40 MG PO TABS: 40.0000 mg | ORAL_TABLET | Freq: Every day | ORAL | 3 refills | Status: DC

## 2017-05-04 MED ORDER — ATENOLOL 100 MG PO TABS: 100.0000 mg | ORAL_TABLET | Freq: Every day | ORAL | 1 refills | Status: DC

## 2017-05-04 MED ORDER — LISINOPRIL-HYDROCHLOROTHIAZIDE 20-25 MG PO TABS: 1.0000 | ORAL_TABLET | Freq: Every day | ORAL | 3 refills | Status: DC

## 2017-05-04 NOTE — Telephone Encounter (Signed)
Patient is not using [facility] as her pharmacy and needs he medications sent to them.

## 2017-05-06 ENCOUNTER — Ambulatory Visit: Payer: Medicare Other | Admitting: Podiatry

## 2017-05-17 ENCOUNTER — Ambulatory Visit: Payer: Medicare Other | Admitting: Podiatry

## 2017-06-03 ENCOUNTER — Ambulatory Visit: Payer: Medicare Other | Admitting: Podiatry

## 2017-06-17 ENCOUNTER — Ambulatory Visit: Payer: Medicare Other | Admitting: Podiatry

## 2017-07-08 ENCOUNTER — Ambulatory Visit: Payer: Medicare Other | Admitting: Podiatry

## 2017-07-21 ENCOUNTER — Ambulatory Visit: Payer: Medicare Other

## 2017-07-29 ENCOUNTER — Ambulatory Visit: Payer: Medicare Other | Admitting: Podiatry

## 2017-08-04 ENCOUNTER — Ambulatory Visit: Payer: Medicare Other

## 2017-08-09 ENCOUNTER — Ambulatory Visit: Payer: Medicare Other | Admitting: Podiatry

## 2017-08-11 ENCOUNTER — Ambulatory Visit: Payer: Medicare Other | Admitting: Unknown Physician Specialty

## 2017-08-12 ENCOUNTER — Ambulatory Visit: Payer: Medicare Other

## 2017-08-18 ENCOUNTER — Ambulatory Visit: Payer: Medicare Other | Admitting: Unknown Physician Specialty

## 2017-08-19 ENCOUNTER — Ambulatory Visit: Payer: Medicare Other | Admitting: Podiatry

## 2017-08-31 ENCOUNTER — Ambulatory Visit: Payer: Medicare Other | Admitting: Unknown Physician Specialty

## 2017-09-02 ENCOUNTER — Ambulatory Visit: Payer: Medicare Other | Admitting: Podiatry

## 2017-09-04 ENCOUNTER — Encounter: Payer: Self-pay | Admitting: Unknown Physician Specialty

## 2017-09-14 ENCOUNTER — Ambulatory Visit: Payer: Medicare Other | Admitting: Unknown Physician Specialty

## 2017-09-22 ENCOUNTER — Encounter: Payer: Self-pay | Admitting: Unknown Physician Specialty

## 2017-09-24 ENCOUNTER — Ambulatory Visit: Payer: Medicare Other | Admitting: Unknown Physician Specialty

## 2017-09-26 ENCOUNTER — Other Ambulatory Visit: Payer: Self-pay | Admitting: Unknown Physician Specialty

## 2017-10-19 ENCOUNTER — Other Ambulatory Visit: Payer: Self-pay | Admitting: Unknown Physician Specialty

## 2017-10-19 NOTE — Telephone Encounter (Signed)
Metformin refill Last Refill:05/04/17 # 180 tab 1 RF Last OV: 02/10/17 (has upcoming appt 10/22/17) PCP: Gabriel Cirriheryl Wicker NP Pharmacy:[facility]  amlodipine refill Last Refill:05/04/17 # 901 RF Last OV: 02/10/17    Atenolol refill Last Refill:05/04/17 # 90 1 RF Last OV: 02/10/17

## 2017-10-22 ENCOUNTER — Ambulatory Visit: Payer: Medicare Other | Admitting: Unknown Physician Specialty

## 2017-10-26 ENCOUNTER — Inpatient Hospital Stay
Admission: EM | Admit: 2017-10-26 | Discharge: 2017-10-30 | DRG: 312 | Disposition: A | Discharge status: Skilled Nursing Facility | Payer: Medicare Other | Attending: Internal Medicine | Admitting: Internal Medicine

## 2017-10-26 ENCOUNTER — Emergency Department: Payer: Medicare Other

## 2017-10-26 ENCOUNTER — Inpatient Hospital Stay: Payer: Medicare Other

## 2017-10-26 ENCOUNTER — Other Ambulatory Visit: Payer: Self-pay

## 2017-10-26 ENCOUNTER — Encounter: Payer: Self-pay | Admitting: Emergency Medicine

## 2017-10-26 DIAGNOSIS — S59901A Unspecified injury of right elbow, initial encounter: Secondary | ICD-10-CM | POA: Diagnosis not present

## 2017-10-26 DIAGNOSIS — M6281 Muscle weakness (generalized): Secondary | ICD-10-CM | POA: Diagnosis not present

## 2017-10-26 DIAGNOSIS — J811 Chronic pulmonary edema: Secondary | ICD-10-CM | POA: Diagnosis not present

## 2017-10-26 DIAGNOSIS — R270 Ataxia, unspecified: Secondary | ICD-10-CM | POA: Diagnosis present

## 2017-10-26 DIAGNOSIS — Z833 Family history of diabetes mellitus: Secondary | ICD-10-CM | POA: Diagnosis not present

## 2017-10-26 DIAGNOSIS — E119 Type 2 diabetes mellitus without complications: Secondary | ICD-10-CM | POA: Diagnosis not present

## 2017-10-26 DIAGNOSIS — Z7401 Bed confinement status: Secondary | ICD-10-CM | POA: Diagnosis not present

## 2017-10-26 DIAGNOSIS — G4730 Sleep apnea, unspecified: Secondary | ICD-10-CM | POA: Diagnosis present

## 2017-10-26 DIAGNOSIS — R2681 Unsteadiness on feet: Secondary | ICD-10-CM | POA: Diagnosis not present

## 2017-10-26 DIAGNOSIS — R29898 Other symptoms and signs involving the musculoskeletal system: Secondary | ICD-10-CM

## 2017-10-26 DIAGNOSIS — K5900 Constipation, unspecified: Secondary | ICD-10-CM | POA: Diagnosis present

## 2017-10-26 DIAGNOSIS — S92401A Displaced unspecified fracture of right great toe, initial encounter for closed fracture: Secondary | ICD-10-CM | POA: Diagnosis not present

## 2017-10-26 DIAGNOSIS — E785 Hyperlipidemia, unspecified: Secondary | ICD-10-CM | POA: Diagnosis not present

## 2017-10-26 DIAGNOSIS — A419 Sepsis, unspecified organism: Secondary | ICD-10-CM | POA: Diagnosis not present

## 2017-10-26 DIAGNOSIS — Z90710 Acquired absence of both cervix and uterus: Secondary | ICD-10-CM | POA: Diagnosis not present

## 2017-10-26 DIAGNOSIS — Z6841 Body Mass Index (BMI) 40.0 and over, adult: Secondary | ICD-10-CM | POA: Diagnosis not present

## 2017-10-26 DIAGNOSIS — I10 Essential (primary) hypertension: Secondary | ICD-10-CM | POA: Diagnosis present

## 2017-10-26 DIAGNOSIS — F5101 Primary insomnia: Secondary | ICD-10-CM | POA: Diagnosis not present

## 2017-10-26 DIAGNOSIS — R296 Repeated falls: Secondary | ICD-10-CM | POA: Diagnosis present

## 2017-10-26 DIAGNOSIS — R509 Fever, unspecified: Secondary | ICD-10-CM

## 2017-10-26 DIAGNOSIS — S92414A Nondisplaced fracture of proximal phalanx of right great toe, initial encounter for closed fracture: Secondary | ICD-10-CM | POA: Diagnosis present

## 2017-10-26 DIAGNOSIS — N170 Acute kidney failure with tubular necrosis: Secondary | ICD-10-CM | POA: Diagnosis not present

## 2017-10-26 DIAGNOSIS — Z7984 Long term (current) use of oral hypoglycemic drugs: Secondary | ICD-10-CM

## 2017-10-26 DIAGNOSIS — S92415A Nondisplaced fracture of proximal phalanx of left great toe, initial encounter for closed fracture: Secondary | ICD-10-CM | POA: Diagnosis not present

## 2017-10-26 DIAGNOSIS — M25521 Pain in right elbow: Secondary | ICD-10-CM | POA: Diagnosis not present

## 2017-10-26 DIAGNOSIS — M150 Primary generalized (osteo)arthritis: Secondary | ICD-10-CM | POA: Diagnosis not present

## 2017-10-26 DIAGNOSIS — S92414S Nondisplaced fracture of proximal phalanx of right great toe, sequela: Secondary | ICD-10-CM | POA: Diagnosis not present

## 2017-10-26 DIAGNOSIS — I951 Orthostatic hypotension: Principal | ICD-10-CM | POA: Diagnosis present

## 2017-10-26 DIAGNOSIS — W1830XA Fall on same level, unspecified, initial encounter: Secondary | ICD-10-CM | POA: Diagnosis not present

## 2017-10-26 DIAGNOSIS — E1129 Type 2 diabetes mellitus with other diabetic kidney complication: Secondary | ICD-10-CM | POA: Diagnosis not present

## 2017-10-26 DIAGNOSIS — Y92009 Unspecified place in unspecified non-institutional (private) residence as the place of occurrence of the external cause: Secondary | ICD-10-CM

## 2017-10-26 DIAGNOSIS — S0990XA Unspecified injury of head, initial encounter: Secondary | ICD-10-CM | POA: Diagnosis not present

## 2017-10-26 DIAGNOSIS — E7849 Other hyperlipidemia: Secondary | ICD-10-CM | POA: Diagnosis not present

## 2017-10-26 DIAGNOSIS — N3000 Acute cystitis without hematuria: Secondary | ICD-10-CM | POA: Diagnosis not present

## 2017-10-26 DIAGNOSIS — S51011A Laceration without foreign body of right elbow, initial encounter: Secondary | ICD-10-CM | POA: Diagnosis present

## 2017-10-26 DIAGNOSIS — R42 Dizziness and giddiness: Secondary | ICD-10-CM | POA: Diagnosis not present

## 2017-10-26 DIAGNOSIS — G4733 Obstructive sleep apnea (adult) (pediatric): Secondary | ICD-10-CM | POA: Diagnosis not present

## 2017-10-26 DIAGNOSIS — Z23 Encounter for immunization: Secondary | ICD-10-CM | POA: Diagnosis present

## 2017-10-26 DIAGNOSIS — S92919A Unspecified fracture of unspecified toe(s), initial encounter for closed fracture: Secondary | ICD-10-CM | POA: Diagnosis present

## 2017-10-26 DIAGNOSIS — R531 Weakness: Secondary | ICD-10-CM | POA: Diagnosis not present

## 2017-10-26 DIAGNOSIS — S51011S Laceration without foreign body of right elbow, sequela: Secondary | ICD-10-CM | POA: Diagnosis not present

## 2017-10-26 DIAGNOSIS — N183 Chronic kidney disease, stage 3 (moderate): Secondary | ICD-10-CM | POA: Diagnosis not present

## 2017-10-26 LAB — CBC
HEMATOCRIT: 33.6 % — AB (ref 35.0–47.0)
Hemoglobin: 11.2 g/dL — ABNORMAL LOW (ref 12.0–16.0)
MCH: 28.3 pg (ref 26.0–34.0)
MCHC: 33.4 g/dL (ref 32.0–36.0)
MCV: 84.7 fL (ref 80.0–100.0)
Platelets: 245 10*3/uL (ref 150–440)
RBC: 3.96 MIL/uL (ref 3.80–5.20)
RDW: 16.9 % — ABNORMAL HIGH (ref 11.5–14.5)
WBC: 8.7 10*3/uL (ref 3.6–11.0)

## 2017-10-26 LAB — URINALYSIS, COMPLETE (UACMP) WITH MICROSCOPIC
BILIRUBIN URINE: NEGATIVE
GLUCOSE, UA: NEGATIVE mg/dL
Ketones, ur: NEGATIVE mg/dL
LEUKOCYTES UA: NEGATIVE
NITRITE: NEGATIVE
Protein, ur: NEGATIVE mg/dL
SPECIFIC GRAVITY, URINE: 1.014 (ref 1.005–1.030)
pH: 5 (ref 5.0–8.0)

## 2017-10-26 LAB — BASIC METABOLIC PANEL
ANION GAP: 11 (ref 5–15)
BUN: 25 mg/dL — ABNORMAL HIGH (ref 8–23)
CO2: 26 mmol/L (ref 22–32)
Calcium: 8.8 mg/dL — ABNORMAL LOW (ref 8.9–10.3)
Chloride: 101 mmol/L (ref 98–111)
Creatinine, Ser: 0.92 mg/dL (ref 0.44–1.00)
GFR calc Af Amer: >60 mL/min (ref >60)
GLUCOSE: 159 mg/dL — AB (ref 70–99)
POTASSIUM: 3.4 mmol/L — AB (ref 3.5–5.1)
Sodium: 138 mmol/L (ref 135–145)

## 2017-10-26 LAB — TROPONIN I: Troponin I: <0.03 ng/mL (ref <0.03)

## 2017-10-26 MED ORDER — ONDANSETRON HCL 4 MG PO TABS: 4.0000 mg | ORAL_TABLET | Freq: Four times a day (QID) | ORAL | Status: DC | PRN

## 2017-10-26 MED ORDER — DOCUSATE SODIUM 100 MG PO CAPS
100.0000 mg | ORAL_CAPSULE | Freq: Two times a day (BID) | ORAL | Status: DC
  Administered 2017-10-27 – 2017-10-30 (×7): 100 mg via ORAL
  Filled 2017-10-26 (×7): qty 1

## 2017-10-26 MED ORDER — HYDROCHLOROTHIAZIDE 25 MG PO TABS: 25.0000 mg | ORAL_TABLET | Freq: Every day | ORAL | Status: DC

## 2017-10-26 MED ORDER — BISACODYL 5 MG PO TBEC
5.0000 mg | DELAYED_RELEASE_TABLET | Freq: Every day | ORAL | Status: DC | PRN
  Administered 2017-10-29: 5 mg via ORAL
  Filled 2017-10-26: qty 1

## 2017-10-26 MED ORDER — HYDROCODONE-ACETAMINOPHEN 5-325 MG PO TABS
1.0000 | ORAL_TABLET | ORAL | Status: DC | PRN
  Administered 2017-10-27 – 2017-10-28 (×3): 1 via ORAL
  Administered 2017-10-28: 20:00:00 2 via ORAL
  Administered 2017-10-29: 1 via ORAL
  Filled 2017-10-26: qty 2
  Filled 2017-10-26: qty 1
  Filled 2017-10-26: qty 2
  Filled 2017-10-26 (×2): qty 1

## 2017-10-26 MED ORDER — INSULIN ASPART 100 UNIT/ML ~~LOC~~ SOLN: 0.0000 [IU] | Freq: Every day | SUBCUTANEOUS | Status: DC

## 2017-10-26 MED ORDER — SODIUM CHLORIDE 0.9 % IV SOLN
Freq: Once | INTRAVENOUS | Status: AC
  Administered 2017-10-27: via INTRAVENOUS

## 2017-10-26 MED ORDER — TETANUS-DIPHTH-ACELL PERTUSSIS 5-2.5-18.5 LF-MCG/0.5 IM SUSP
0.5000 mL | Freq: Once | INTRAMUSCULAR | Status: AC
  Administered 2017-10-26: 0.5 mL via INTRAMUSCULAR
  Filled 2017-10-26: qty 0.5

## 2017-10-26 MED ORDER — ACETAMINOPHEN 650 MG RE SUPP: 650.0000 mg | Freq: Four times a day (QID) | RECTAL | Status: DC | PRN

## 2017-10-26 MED ORDER — ATENOLOL 100 MG PO TABS
100.0000 mg | ORAL_TABLET | Freq: Every day | ORAL | Status: DC
  Administered 2017-10-27 – 2017-10-28 (×2): 100 mg via ORAL
  Filled 2017-10-26 (×3): qty 1

## 2017-10-26 MED ORDER — LISINOPRIL-HYDROCHLOROTHIAZIDE 20-25 MG PO TABS
1.0000 | ORAL_TABLET | Freq: Every day | ORAL | Status: DC
Start: 2017-10-27 — End: 2017-10-26

## 2017-10-26 MED ORDER — SODIUM CHLORIDE 0.9 % IV BOLUS
1000.0000 mL | Freq: Once | INTRAVENOUS | Status: AC
  Administered 2017-10-26: 1000 mL via INTRAVENOUS

## 2017-10-26 MED ORDER — ONDANSETRON HCL 4 MG/2ML IJ SOLN: 4.0000 mg | Freq: Four times a day (QID) | INTRAMUSCULAR | Status: DC | PRN

## 2017-10-26 MED ORDER — HEPARIN SODIUM (PORCINE) 5000 UNIT/ML IJ SOLN
5000.0000 [IU] | Freq: Three times a day (TID) | INTRAMUSCULAR | Status: DC
  Administered 2017-10-27 – 2017-10-30 (×10): 5000 [IU] via SUBCUTANEOUS
  Filled 2017-10-26 (×10): qty 1

## 2017-10-26 MED ORDER — ACETAMINOPHEN 325 MG PO TABS
650.0000 mg | ORAL_TABLET | Freq: Four times a day (QID) | ORAL | Status: DC | PRN
  Administered 2017-10-27 – 2017-10-29 (×4): 650 mg via ORAL
  Filled 2017-10-26 (×4): qty 2

## 2017-10-26 MED ORDER — LISINOPRIL 20 MG PO TABS
20.0000 mg | ORAL_TABLET | Freq: Every day | ORAL | Status: DC
  Administered 2017-10-27: 20 mg via ORAL
  Filled 2017-10-26: qty 1

## 2017-10-26 MED ORDER — INSULIN ASPART 100 UNIT/ML ~~LOC~~ SOLN
0.0000 [IU] | Freq: Three times a day (TID) | SUBCUTANEOUS | Status: DC
  Administered 2017-10-27 (×2): 2 [IU] via SUBCUTANEOUS
  Administered 2017-10-28: 18:00:00 3 [IU] via SUBCUTANEOUS
  Administered 2017-10-29: 17:00:00 2 [IU] via SUBCUTANEOUS
  Administered 2017-10-30: 08:00:00 3 [IU] via SUBCUTANEOUS
  Administered 2017-10-30: 12:00:00 2 [IU] via SUBCUTANEOUS
  Filled 2017-10-26 (×6): qty 1

## 2017-10-26 MED ORDER — LORAZEPAM 2 MG/ML IJ SOLN
2.0000 mg | Freq: Once | INTRAMUSCULAR | Status: AC
  Administered 2017-10-26: 2 mg via INTRAVENOUS
  Filled 2017-10-26: qty 1

## 2017-10-26 MED ORDER — LIDOCAINE HCL (PF) 1 % IJ SOLN
INTRAMUSCULAR | Status: AC
  Administered 2017-10-26: 21:00:00
  Filled 2017-10-26: qty 5

## 2017-10-26 MED ORDER — AMLODIPINE BESYLATE 10 MG PO TABS: 10.0000 mg | ORAL_TABLET | Freq: Every day | ORAL | Status: DC

## 2017-10-26 MED ORDER — DOUBLE ANTIBIOTIC 500-10000 UNIT/GM EX OINT
TOPICAL_OINTMENT | Freq: Once | CUTANEOUS | Status: AC
  Administered 2017-10-26: 21:00:00 via TOPICAL
  Filled 2017-10-26: qty 28.4

## 2017-10-26 MED ORDER — TRAZODONE HCL 50 MG PO TABS
25.0000 mg | ORAL_TABLET | Freq: Every evening | ORAL | Status: DC | PRN
  Administered 2017-10-27: 25 mg via ORAL
  Filled 2017-10-26: qty 1

## 2017-10-26 NOTE — H&P (Signed)
[facility] - [place] [facility]   PATIENT NAME: Priscilla Johnson    MR#:  [id number]  DATE OF BIRTH:  01/27/1946  DATE OF ADMISSION:  10/26/2017  PRIMARY CARE PHYSICIAN: Gabriel Cirri, NP   REQUESTING/REFERRING PHYSICIAN:   CHIEF COMPLAINT:   Chief Complaint  Patient presents with  . Fall  . Weakness    HISTORY OF PRESENT ILLNESS: Priscilla Johnson  is a 72 y.o. female with a known history of diabetes type 2, hypertension, hyperlipidemia, morbid obesity and sleep apnea. Patient presented to emergency room for recurrent mechanical falls.  She feels that her legs give out on her, especially the left leg.  Patient states that she is using a walker, but continues to have falls.  She denies any pain or numbness in her lower extremities.  She denies chest pain, palpitations, loss of consciousness.  She does complain of lightheadedness, at times when standing up from sitting position, especially in the evenings.  Patient takes all her blood pressure medications in the evening.  The last 2 most recent falls caused her to hit her head, right elbow and right great toe. Blood test done emergency room, including CBC and CMP, are grossly unremarkable.  Troponin level is within normal limits.  UA is negative for UTI. CT and MRI of the brain are unremarkable.  Right foot x-ray shows nondisplaced fracture base of the proximal phalanx of the great toe.  She is admitted for further evaluation and treatment.   PAST MEDICAL HISTORY:   Past Medical History:  Diagnosis Date  . Diabetes mellitus without complication (HCC)   . Gout   . Hyperlipidemia   . Hypertension   . Insomnia   . Shoulder pain, right   . Sleep apnea     PAST SURGICAL HISTORY:  Past Surgical History:  Procedure Laterality Date  . ABDOMINAL HYSTERECTOMY      SOCIAL HISTORY:  Social History   Tobacco Use  . Smoking status: Never Smoker  . Smokeless tobacco: Never Used  Substance Use Topics   . Alcohol use: Yes    Comment: on occasion    FAMILY HISTORY:  Family History  Problem Relation Age of Onset  . Stroke Mother   . Diabetes Brother   . Diabetes Maternal Grandfather   . Diabetes Brother   . Sleep apnea Brother     DRUG ALLERGIES: No Known Allergies  REVIEW OF SYSTEMS:   CONSTITUTIONAL: No fever, the patient complains of chronic fatigue and generalized weakness.  EYES: No blurred or double vision.  EARS, NOSE, AND THROAT: No tinnitus or ear pain.  RESPIRATORY: No cough, shortness of breath, wheezing or hemoptysis.  CARDIOVASCULAR: No chest pain, orthopnea, edema.  Positive for occasional lightheadedness when standing up from sitting position. GASTROINTESTINAL: No nausea, vomiting, diarrhea or abdominal pain.  GENITOURINARY: No dysuria, hematuria.  ENDOCRINE: No polyuria, nocturia,  HEMATOLOGY: No bleeding SKIN: No rash or lesion. MUSCULOSKELETAL: Positive for generalized weakness, especially in the lower extremities when walking.   NEUROLOGIC: No focal weakness.  PSYCHIATRY: No anxiety or depression.   MEDICATIONS AT HOME:  Prior to Admission medications   Medication Sig Start Date End Date Taking? Authorizing Provider  amLODipine (NORVASC) 10 MG tablet TAKE 1 TABLET BY MOUTH  DAILY 10/19/17  Yes Gabriel Cirri, NP  atenolol (TENORMIN) 100 MG tablet TAKE 1 TABLET BY MOUTH  DAILY 10/19/17  Yes Gabriel Cirri, NP  BD PEN NEEDLE NANO U/F 32G X 4 MM MISC USE EVERY DAY 12/24/15  Yes Johnson, Megan P, DO  fluocinonide ointment (LIDEX) 0.05 % Apply 1 application topically as needed (dermatitis).  04/17/15  Yes [provider]  glipiZIDE (GLUCOTROL) 5 MG tablet TAKE 1 TABLET BY MOUTH TWO  TIMES DAILY 09/27/17  Yes [name], [name], NP  lisinopril-hydrochlorothiazide (PRINZIDE,ZESTORETIC) 20-25 MG tablet Take 1 tablet by mouth daily. 05/04/17  Yes [name], [name], NP  metFORMIN (GLUCOPHAGE) 500 MG tablet TAKE 1 TABLET BY MOUTH TWO  TIMES DAILY 10/19/17  Yes [name],  [name], NP  pioglitazone (ACTOS) 30 MG tablet TAKE 1 TABLET BY MOUTH  DAILY 09/27/17  Yes [name], [name], NP  allopurinol (ZYLOPRIM) 100 MG tablet TAKE 1 TABLET BY MOUTH EVERY DAY FOR 1 MONTH THEN TAKE 2 TABLETS BY MOUTH EVERY DAY FOR 1 MONTH THEN TAKE 3 TABLETS BY MOUTH EVERY DAY Patient not taking: Reported on 10/26/2017 07/28/16   [name], [name], NP  atorvastatin (LIPITOR) 40 MG tablet Take 1 tablet (40 mg total) by mouth daily at 6 PM. Patient not taking: Reported on 10/26/2017 05/04/17   [name], [name], NP      PHYSICAL EXAMINATION:   VITAL SIGNS: Blood pressure (!) 135/118, pulse 83, temperature 97.7 F (36.5 C), temperature source Oral, resp. rate 19, height 5\' 2"  (1.575 m), weight 130.2 kg (287 lb), SpO2 95 %.  GENERAL:  72 y.o.-year-old patient lying in the bed with no acute distress.  EYES: Pupils equal, round, reactive to light and accommodation. No scleral icterus. Extraocular muscles intact.  HEENT: A small hematoma is palpated at the back of the head. Oropharynx and nasopharynx clear.  NECK:  Supple, no jugular venous distention. No thyroid enlargement, no tenderness.  LUNGS: Normal breath sounds bilaterally, no wheezing, rales,rhonchi or crepitation. No use of accessory muscles of respiration.  CARDIOVASCULAR: S1, S2 normal. No S3/S4.  ABDOMEN: Soft, nontender, nondistended. Bowel sounds present. No organomegaly or mass.  EXTREMITIES: Right big toe is noted with edema and ecchymosis. NEUROLOGIC: Cranial nerves II through XII are intact. Muscle strength 5/5 in all extremities. Sensation intact. Gait is unstable.  PSYCHIATRIC: The patient is alert and oriented x 3.  SKIN: There is a 1 cm linear laceration at the right elbow area, no bleeding.  LABORATORY PANEL:   CBC Recent Labs  Lab 10/26/17 1820  WBC 8.7  HGB 11.2*  HCT 33.6*  PLT 245  MCV 84.7  MCH 28.3  MCHC 33.4  RDW 16.9*    ------------------------------------------------------------------------------------------------------------------  Chemistries  Recent Labs  Lab 10/26/17 1820  NA 138  K 3.4*  CL 101  CO2 26  GLUCOSE 159*  BUN 25*  CREATININE 0.92  CALCIUM 8.8*   ------------------------------------------------------------------------------------------------------------------ estimated creatinine clearance is 70.6 mL/min (by C-G formula based on SCr of 0.92 mg/dL). ------------------------------------------------------------------------------------------------------------------ No results for input(s): TSH, T4TOTAL, T3FREE, THYROIDAB in the last 72 hours.  Invalid input(s): FREET3   Coagulation profile No results for input(s): INR, PROTIME in the last 168 hours. ------------------------------------------------------------------------------------------------------------------- No results for input(s): DDIMER in the last 72 hours. -------------------------------------------------------------------------------------------------------------------  Cardiac Enzymes Recent Labs  Lab 10/26/17 1831  TROPONINI <0.03   ------------------------------------------------------------------------------------------------------------------ Invalid input(s): POCBNP  ---------------------------------------------------------------------------------------------------------------  Urinalysis    Component Value Date/Time   COLORURINE YELLOW (A) 10/26/2017 1831   APPEARANCEUR HAZY (A) 10/26/2017 1831   LABSPEC 1.014 10/26/2017 1831   PHURINE 5.0 10/26/2017 1831   GLUCOSEU NEGATIVE 10/26/2017 1831   HGBUR SMALL (A) 10/26/2017 1831   BILIRUBINUR NEGATIVE 10/26/2017 1831   KETONESUR NEGATIVE 10/26/2017 1831   PROTEINUR NEGATIVE 10/26/2017 1831   NITRITE  NEGATIVE 10/26/2017 1831   LEUKOCYTESUR NEGATIVE 10/26/2017 1831     RADIOLOGY: Dg Elbow Complete Right  Result Date: 10/26/2017 CLINICAL DATA:   Recurrent falls over the past week. Right elbow pain. Initial encounter. EXAM: RIGHT ELBOW - COMPLETE 3+ VIEW COMPARISON:  None. FINDINGS: There is no evidence of fracture, dislocation, or joint effusion. There is some degenerative disease about the elbow. Soft tissues are unremarkable. IMPRESSION: No acute abnormality. Electronically Signed   By: Drusilla Kanner M.D.   On: 10/26/2017 19:29   Ct Head Wo Contrast  Result Date: 10/26/2017 CLINICAL DATA:  Fall.  Dizziness. EXAM: CT HEAD WITHOUT CONTRAST TECHNIQUE: Contiguous axial images were obtained from the base of the skull through the vertex without intravenous contrast. COMPARISON:  None. FINDINGS: Brain: No subdural, epidural, or subarachnoid hemorrhage. Cerebellum, brainstem, and basal cisterns are normal. Ventricles and sulci are unremarkable. No mass effect or midline shift. No acute cortical ischemia or infarct. Vascular: Calcified atherosclerosis in the intracranial carotids. Skull: Normal. Negative for fracture or focal lesion. Sinuses/Orbits: No acute finding. Other: None. IMPRESSION: No acute intracranial abnormalities. Electronically Signed   By: Gerome Sam III M.D   On: 10/26/2017 19:26   Dg Toe Great Right  Result Date: 10/26/2017 CLINICAL DATA:  Right great toe pain. Recurrent falls over the past week. Initial encounter. EXAM: RIGHT GREAT TOE COMPARISON:  Plain films right foot 07/10/2014. FINDINGS: The patient has a nondisplaced fracture through the base of the proximal phalanx of the right great toe. The fracture extends to the lateral corner of the articular surface at the first MTP joint. No other acute abnormality is identified. IMPRESSION: Nondisplaced fracture base of the proximal phalanx of the great toe. Electronically Signed   By: Drusilla Kanner M.D.   On: 10/26/2017 19:31    EKG: Orders placed or performed during the hospital encounter of 10/26/17  . ED EKG  . ED EKG  . EKG 12-Lead  . EKG 12-Lead    IMPRESSION AND  PLAN:  1.  Recurrent falls, secondary to unstable gait.  Will have PT/OT evaluate and treat the patient. 2.  Unstable gait, likely multifactorial, related to deconditioning, osteoarthritis and possibly orthostatic hypotension.  I advised the patient to avoid taking all blood pressure medications at the same time.  I would also recommend reducing the Norvasc dose from 10 mg to 5 mg and eliminating hydrochlorothiazide.  We will continue to monitor clinically closely and follow blood pressure readings. 3.  Right great toe fracture, status post mechanical fall. We will splint this by buddy taping it to the adjoining toe.  Patient should wear flat soled shoes, for few weeks until healing is complete. 4.  Right elbow laceration, status post mechanical fall.  This was sutured in the emergency room. 5.  Hypertension, stable.  Will adjust medications as mentioned above under #2. 6.  Diabetes type 2.  Will start low-carb diet and monitor blood sugars before meals and at bedtime.  Will use insulin treatment during the hospital stay.   All the records are reviewed and case discussed with ED provider. Management plans discussed with the patient, family and they are in agreement.  CODE STATUS: Full    TOTAL TIME TAKING CARE OF THIS PATIENT: 45 minutes.    Cammy Copa M.D on 10/26/2017 at 10:07 PM  Between 7am to 6pm - Pager - [phone]  After 6pm go to [email or web address] - password [email or web address] [facility]  [facility]  Office  [phone]  CC: Primary care physician;  Gabriel Cirri, NP

## 2017-10-26 NOTE — ED Notes (Signed)
Patient is resting comfortably. 

## 2017-10-26 NOTE — ED Notes (Signed)
Patient transported to CT 

## 2017-10-26 NOTE — ED Notes (Signed)
Patient transported to MRI 

## 2017-10-26 NOTE — ED Notes (Signed)
Cole RN aware of bed assigned  

## 2017-10-26 NOTE — ED Provider Notes (Addendum)
[facility] Emergency Department Provider Note  ____________________________________________  Time seen: Approximately 6:57 PM  I have reviewed the triage vital signs and the nursing notes.   HISTORY  Chief Complaint Fall and Weakness    HPI Priscilla Johnson is a 72 y.o. female with a history of HTN, HL, DM, morbid obesity, presenting with left leg weakness and recurrent falls.  The patient reports that for the past several months, she has had a sensation of left leg weakness when she is standing or walking, then it becomes "wobbly."  If she does not find somewhere to sit down, she often falls down.  She is using a walker but continues to have falls.  The patient denies any associated back pain, urinary or fecal incontinence, saddle anesthesia, numbness or tingling.  Yesterday, the patient had a fall injuring her right great toe resulting in pain, ecchymosis.  Today, she had another typical episode and fell backwards, striking the posterior aspect of her head.  She did not lose consciousness, does not have a headache and has not had any nausea or vomiting.  The patient denies any numbness or tingling, visual changes, speech changes, mental status changes.  She also denies any associated lightheadedness or syncope, chest pain, palpitations, or shortness of breath.  She lives independently with her husband.  Past Medical History:  Diagnosis Date  . Diabetes mellitus without complication (HCC)   . Gout   . Hyperlipidemia   . Hypertension   . Insomnia   . Shoulder pain, right   . Sleep apnea     Patient Active Problem List   Diagnosis Date Noted  . Anemia 07/21/2016  . Advanced care planning/counseling discussion 07/20/2016  . Gout 07/20/2016  . Essential hypertension 08/23/2015  . Severe obesity (BMI 35.0-35.9 with comorbidity) (HCC) 04/18/2015  . Insomnia 01/16/2015  . Sleep apnea 01/16/2015  . Type II diabetes mellitus with renal manifestations (HCC)  01/16/2015  . Hypertensive CKD (chronic kidney disease) 01/16/2015  . Severe obesity (HCC) 01/16/2015  . CKD (chronic kidney disease), stage III (HCC) 01/16/2015  . Hyperlipidemia 01/16/2015    Past Surgical History:  Procedure Laterality Date  . ABDOMINAL HYSTERECTOMY      Current Outpatient Rx  . Order #: [id number] Class: Normal  . Order #: [id number] Class: Normal  . Order #: [id number] Class: Historical Med  . Order #: [id number] Class: Normal  . Order #: [id number] Class: Normal  . Order #: [id number] Class: Normal  . Order #: [id number] Class: Historical Med  . Order #: [id number] Class: Normal  . Order #: [id number] Class: Normal  . Order #: [id number] Class: Normal  . Order #: [id number] Class: Normal    Allergies Patient has no known allergies.  Family History  Problem Relation Age of Onset  . Stroke Mother   . Diabetes Brother   . Diabetes Maternal Grandfather   . Diabetes Brother   . Sleep apnea Brother     Social History Social History   Tobacco Use  . Smoking status: Never Smoker  . Smokeless tobacco: Never Used  Substance Use Topics  . Alcohol use: Yes    Comment: on occasion  . Drug use: No    Review of Systems Constitutional: No fever/chills.  No lightheadedness or syncope.  Positive mechanical fall.  No changes in mental status. Eyes: No visual changes.  No blurred or double vision. ENT: No sore throat. No congestion or rhinorrhea. Cardiovascular: Denies chest pain. Denies palpitations. Respiratory: Denies shortness of breath.  No cough. Gastrointestinal: No abdominal pain.  No nausea,  no vomiting.  No diarrhea.  No constipation. Genitourinary: Negative for dysuria.  No urinary frequency.   Musculoskeletal: Negative for back pain. Skin: Negative for rash. Neurological: Negative for headaches. No focal numbness, tingling.  Positive left leg weakness.  No saddle anesthesia, urinary or fecal incontinence or  retention.    ____________________________________________   PHYSICAL EXAM:  VITAL SIGNS: ED Triage Vitals  Enc Vitals Group     BP 10/26/17 1811 (!) 156/57     Pulse Rate 10/26/17 1811 82     Resp 10/26/17 1811 18     Temp 10/26/17 1811 97.7 F (36.5 C)     Temp Source 10/26/17 1811 Oral     SpO2 10/26/17 1811 95 %     Weight 10/26/17 1812 287 lb (130.2 kg)     Height 10/26/17 1812 5\' 2"  (1.575 m)     Head Circumference --      Peak Flow --      Pain Score 10/26/17 1812 5     Pain Loc --      Pain Edu? --      Excl. in GC? --     Constitutional: Alert and oriented.  Answers questions appropriately.  Chronically ill-appearing with poor hygiene.  Morbidly obese. Eyes: Conjunctivae are normal.  EOMI. No scleral icterus. Head: Atraumatic. Nose: No congestion/rhinnorhea. Mouth/Throat: Mucous membranes are moist.  Neck: No stridor.  Supple.  No JVD.  No meningismus. Cardiovascular: Normal rate, regular rhythm. No murmurs, rubs or gallops.  Respiratory: Normal respiratory effort.  No accessory muscle use or retractions. Lungs CTAB.  No wheezes, rales or ronchi. Gastrointestinal: Obese.  Soft, nontender and nondistended.  No guarding or rebound.  No peritoneal signs. Musculoskeletal: Pelvis is stable.  The patient has full range of motion of the bilateral wrists, elbows, shoulders, ankles, knees and hips without pain.  She has normal DP and PT pulses bilaterally.  No LE edema. No ttp in the calves or palpable cords.  Negative Homan's sign. Neurologic: Alert and oriented 3. Speech is clear. Face and smile symmetric.  EOMI.  PERRLA. No pronator drift. 5 out of 5 grip, biceps, triceps plantar flexion and dorsiflexion.  4+ out of 5 bilateral hip flexors.  Normal sensation to light touch in the bilateral upper and lower extremities, and face.  Skin:  Skin is warm, dry. No rash noted.  1 cm laceration over the right elbow.  The patient also has an erythematous rash in the inguinal creases  bilaterally. Psychiatric: Mood and affect are normal. Speech and behavior are normal.  Normal judgement.  ____________________________________________   LABS (all labs ordered are listed, but only abnormal results are displayed)  Labs Reviewed  CBC - Abnormal; Notable for the following components:      Result Value   Hemoglobin 11.2 (*)    HCT 33.6 (*)    RDW 16.9 (*)    All other components within normal limits  BASIC METABOLIC PANEL - Abnormal; Notable for the following components:   Potassium 3.4 (*)    Glucose, Bld 159 (*)    BUN 25 (*)    Calcium 8.8 (*)    All other components within normal limits  URINALYSIS, COMPLETE (UACMP) WITH MICROSCOPIC - Abnormal; Notable for the following components:   Color, Urine YELLOW (*)    APPearance HAZY (*)    Hgb urine dipstick SMALL (*)    Bacteria, UA FEW (*)    All other components within normal limits  URINE CULTURE  TROPONIN  I   ____________________________________________  EKG  ED ECG REPORT I, Rockne Menghini, the attending physician, personally viewed and interpreted this ECG.   Date: 10/26/2017  EKG Time: 1814  Rate: 81  Rhythm: normal sinus rhythm  Axis: normal  Intervals:1st degree block  ST&T Change: NO STEMI  ____________________________________________  RADIOLOGY  Dg Elbow Complete Right  Result Date: 10/26/2017 CLINICAL DATA:  Recurrent falls over the past week. Right elbow pain. Initial encounter. EXAM: RIGHT ELBOW - COMPLETE 3+ VIEW COMPARISON:  None. FINDINGS: There is no evidence of fracture, dislocation, or joint effusion. There is some degenerative disease about the elbow. Soft tissues are unremarkable. IMPRESSION: No acute abnormality. Electronically Signed   By: Drusilla Kanner M.D.   On: 10/26/2017 19:29   Ct Head Wo Contrast  Result Date: 10/26/2017 CLINICAL DATA:  Fall.  Dizziness. EXAM: CT HEAD WITHOUT CONTRAST TECHNIQUE: Contiguous axial images were obtained from the base of the skull  through the vertex without intravenous contrast. COMPARISON:  None. FINDINGS: Brain: No subdural, epidural, or subarachnoid hemorrhage. Cerebellum, brainstem, and basal cisterns are normal. Ventricles and sulci are unremarkable. No mass effect or midline shift. No acute cortical ischemia or infarct. Vascular: Calcified atherosclerosis in the intracranial carotids. Skull: Normal. Negative for fracture or focal lesion. Sinuses/Orbits: No acute finding. Other: None. IMPRESSION: No acute intracranial abnormalities. Electronically Signed   By: Gerome Sam III M.D   On: 10/26/2017 19:26   Dg Toe Great Right  Result Date: 10/26/2017 CLINICAL DATA:  Right great toe pain. Recurrent falls over the past week. Initial encounter. EXAM: RIGHT GREAT TOE COMPARISON:  Plain films right foot 07/10/2014. FINDINGS: The patient has a nondisplaced fracture through the base of the proximal phalanx of the right great toe. The fracture extends to the lateral corner of the articular surface at the first MTP joint. No other acute abnormality is identified. IMPRESSION: Nondisplaced fracture base of the proximal phalanx of the great toe. Electronically Signed   By: Drusilla Kanner M.D.   On: 10/26/2017 19:31    ____________________________________________   PROCEDURES  Procedure(s) performed: None  .Splint Application Date/Time: 10/26/2017 8:02 PM Performed by: Rockne Menghini, MD Authorized by: Rockne Menghini, MD   Consent:    Consent obtained:  Verbal   Consent given by:  Patient Pre-procedure details:    Sensation:  Normal   Skin color:  Purple/ecchymotic Procedure details:    Laterality:  Right   Location:  Toe   Toe:  R big toe   Strapping: no   Post-procedure details:    Sensation:  Normal Comments:     R great toe splinted to the second toe with tape.  Marland Kitchen.Laceration Repair Date/Time: 10/26/2017 8:18 PM Performed by: Rockne Menghini, MD Authorized by: Rockne Menghini, MD    Consent:    Consent obtained:  Verbal   Consent given by:  Patient   Risks discussed:  Infection and pain   Alternatives discussed:  No treatment Anesthesia (see MAR for exact dosages):    Anesthesia method:  Local infiltration   Local anesthetic:  Lidocaine 1% w/o epi Laceration details:    Location:  Shoulder/arm   Shoulder/arm location:  R elbow   Length (cm):  1.5 Repair type:    Repair type:  Simple Pre-procedure details:    Preparation:  Patient was prepped and draped in usual sterile fashion and imaging obtained to evaluate for foreign bodies Treatment:    Area cleansed with:  Saline   Amount of cleaning:  Standard Skin repair:  Repair method:  Sutures   Suture size:  4-0   Suture material:  Prolene   Number of sutures:  3 Approximation:    Approximation:  Close Post-procedure details:    Dressing:  Antibiotic ointment and non-adherent dressing   Patient tolerance of procedure:  Tolerated well, no immediate complications    Critical Care performed: No ____________________________________________   INITIAL IMPRESSION / ASSESSMENT AND PLAN / ED COURSE  Pertinent labs & imaging results that were available during my care of the patient were reviewed by me and considered in my medical decision making (see chart for details).  72 y.o. female with a history of HTN, HL and DM presenting for left leg weakness for the past several weeks, and recurrent falls.  From a trauma standpoint, will get a CT of the head to rule out any injury from the patient's fall, as well as x-rays of the right elbow and the right great toe.  The patient does have a laceration over the right elbow that will need to be sutured and she will receive Tdap.  The patient's left leg weakness could have multiple etiologies.  Overall, the patient is very medically decompensated and I do not see a significant or profound difference between her right lower and her left lower extremity.  However, we will  do CT of the head and if that is negative proceed with MRI for stroke evaluation.  The patient does not have any signs or symptoms that would be consistent with sciatica, or any spinal cord compression or cauda equina syndrome.  In addition, basic laboratory studies, urinalysis, have been ordered.    I have had a long discussion with the patient about her final disposition.  She is using a walker and continues to have multiple falls.  I am concerned about her safety.  Plan reevaluation for final disposition.  ----------------------------------------- 8:00 PM on 10/26/2017 -----------------------------------------  The patient has bacteriuria but no other evidence of UTI.  I have sent the urine for culture; antibiotics are not acutely indicated.  The patient's elbow x-ray does not show any fracture.  Her right great toe has a nondisplaced fracture at the base of the proximal phalanx.  We will splint this by buddy taping it to the adjoining toe.  The CT of her head does not show any acute intracranial process  ----------------------------------------- 8:19 PM on 10/26/2017 -----------------------------------------  The patient has had her right elbow laceration sutured, and her great toe fracture splinted.  Her CT scan did not show stroke but we will proceed with admission and MRI.  The patient becomes dizzy every time she sits up or lays back, so we will continue with intravenous fluids and reevaluation.  The patient will be admitted to the hospitalist at this time. ____________________________________________  FINAL CLINICAL IMPRESSION(S) / ED DIAGNOSES  Final diagnoses:  Nondisplaced fracture of proximal phalanx of right great toe, initial encounter for closed fracture  Laceration of right elbow, initial encounter  Recurrent falls  Left leg weakness         NEW MEDICATIONS STARTED DURING THIS VISIT:  New Prescriptions   No medications on file      Rockne Menghini,  MD 10/26/17 2003    Rockne Menghini, MD 10/26/17 2020

## 2017-10-26 NOTE — ED Notes (Signed)
Pt undressed for MRI and has taken out upper denture and put in patient belongings bag to take up to floor. Selena BattenKim, Rn aware.

## 2017-10-26 NOTE — ED Triage Notes (Signed)
Pt presents to ED via [facility] from home c/o multiple falls over last week. Pt states she feels like her legs give out on her. No LOC. Abrasion/skin tear to R elbow and bruising to R great toe. Pt reports hitting back of head today with fall. Denies blood thinner use. Denies pain other than R toe.

## 2017-10-27 LAB — GLUCOSE, CAPILLARY
GLUCOSE-CAPILLARY: 129 mg/dL — AB (ref 70–99)
GLUCOSE-CAPILLARY: 134 mg/dL — AB (ref 70–99)
Glucose-Capillary: 101 mg/dL — ABNORMAL HIGH (ref 70–99)
Glucose-Capillary: 144 mg/dL — ABNORMAL HIGH (ref 70–99)
Glucose-Capillary: 67 mg/dL — ABNORMAL LOW (ref 70–99)
Glucose-Capillary: 98 mg/dL (ref 70–99)

## 2017-10-27 LAB — BASIC METABOLIC PANEL
ANION GAP: 7 (ref 5–15)
BUN: 21 mg/dL (ref 8–23)
CHLORIDE: 104 mmol/L (ref 98–111)
CO2: 28 mmol/L (ref 22–32)
CREATININE: 0.76 mg/dL (ref 0.44–1.00)
Calcium: 8.4 mg/dL — ABNORMAL LOW (ref 8.9–10.3)
GFR calc non Af Amer: >60 mL/min (ref >60)
Glucose, Bld: 154 mg/dL — ABNORMAL HIGH (ref 70–99)
POTASSIUM: 3.7 mmol/L (ref 3.5–5.1)
SODIUM: 139 mmol/L (ref 135–145)

## 2017-10-27 LAB — CBC
HCT: 31.1 % — ABNORMAL LOW (ref 35.0–47.0)
Hemoglobin: 10.3 g/dL — ABNORMAL LOW (ref 12.0–16.0)
MCH: 28.1 pg (ref 26.0–34.0)
MCHC: 33 g/dL (ref 32.0–36.0)
MCV: 85.1 fL (ref 80.0–100.0)
PLATELETS: 209 10*3/uL (ref 150–440)
RBC: 3.65 MIL/uL — ABNORMAL LOW (ref 3.80–5.20)
RDW: 16.6 % — AB (ref 11.5–14.5)
WBC: 6.7 10*3/uL (ref 3.6–11.0)

## 2017-10-27 MED ORDER — METFORMIN HCL 500 MG PO TABS
500.0000 mg | ORAL_TABLET | Freq: Two times a day (BID) | ORAL | Status: DC
  Administered 2017-10-27 – 2017-10-28 (×4): 500 mg via ORAL
  Filled 2017-10-27 (×5): qty 1

## 2017-10-27 MED ORDER — PIOGLITAZONE HCL 30 MG PO TABS
30.0000 mg | ORAL_TABLET | Freq: Every day | ORAL | Status: DC
  Administered 2017-10-27: 30 mg via ORAL
  Filled 2017-10-27 (×3): qty 1

## 2017-10-27 MED ORDER — LISINOPRIL 20 MG PO TABS
20.0000 mg | ORAL_TABLET | Freq: Every day | ORAL | Status: DC
  Administered 2017-10-27 – 2017-10-28 (×2): 20 mg via ORAL
  Filled 2017-10-27 (×2): qty 1

## 2017-10-27 MED ORDER — AMLODIPINE BESYLATE 5 MG PO TABS
5.0000 mg | ORAL_TABLET | Freq: Every day | ORAL | Status: DC
  Administered 2017-10-27: 09:00:00 5 mg via ORAL
  Filled 2017-10-27: qty 1

## 2017-10-27 MED ORDER — GLIPIZIDE 5 MG PO TABS
5.0000 mg | ORAL_TABLET | Freq: Two times a day (BID) | ORAL | Status: DC
  Administered 2017-10-27 – 2017-10-28 (×3): 5 mg via ORAL
  Filled 2017-10-27 (×5): qty 1

## 2017-10-27 MED ORDER — AMLODIPINE BESYLATE 10 MG PO TABS
10.0000 mg | ORAL_TABLET | Freq: Every day | ORAL | Status: DC
  Administered 2017-10-27 – 2017-10-28 (×2): 10 mg via ORAL
  Filled 2017-10-27 (×2): qty 1

## 2017-10-27 MED ORDER — HYDROCHLOROTHIAZIDE 25 MG PO TABS
25.0000 mg | ORAL_TABLET | Freq: Every day | ORAL | Status: DC
  Administered 2017-10-27: 25 mg via ORAL
  Filled 2017-10-27: qty 1

## 2017-10-27 MED ORDER — LISINOPRIL-HYDROCHLOROTHIAZIDE 20-25 MG PO TABS: 1.0000 | ORAL_TABLET | Freq: Every day | ORAL | Status: DC

## 2017-10-27 NOTE — Clinical Social Work Note (Signed)
Clinical Social Work Assessment  Patient Details  Name: Priscilla Johnson MRN: [id number] Date of Birth: 09-04-1945  Date of referral:  10/27/17               Reason for consult:  Facility Placement                Permission sought to share information with:  Chartered certified accountant granted to share information::  Yes, Verbal Permission Granted  Name::      [name]::   [name]   Relationship::     Contact Information:     Housing/Transportation Living arrangements for the past 2 months:  [place] of Information:  Patient Patient Interpreter Needed:  None Criminal Activity/Legal Involvement Pertinent to Current Situation/Hospitalization:  No - Comment as needed Significant Relationships:  Spouse Lives with:  Spouse Do you feel safe going back to the place where you live?  Yes Need for family participation in patient care:  Yes (Comment)  Care giving concerns:  Patient lives with her husband Priscilla Johnson in [place].    Social Worker assessment / plan:  Holiday representative (CSW) reviewed chart and noted that PT is recommending SNF. CSW met with patient alone at bedside to discuss D/C plan. Per patient she lives in [place] with her husband in [place]. Patient reported that she does have [place] for health insurance. CSW explained that [facility] will have to approve SNF. Patient is agreeable to SNF search in [facility]. FL2 complete and faxed out. CSW will continue to follow and assist as needed.    Employment status:  Retired Nurse, adult PT Recommendations:  [place] / Referral to community resources:  [place]  Patient/Family's Response to care:  Patient is agreeable to [facility] in [place].   Patient/Family's Understanding of and Emotional Response to Diagnosis, Current Treatment, and Prognosis:  Patient was very pleasant  and thanked CSW for assistance.   Emotional Assessment Appearance:  Appears stated age Attitude/Demeanor/Rapport:    Affect (typically observed):  Accepting, Adaptable, Pleasant Orientation:  Oriented to Self, Oriented to Place, Oriented to  Time, Oriented to Situation Alcohol / Substance use:  Not Applicable Psych involvement (Current and /or in the community):  No (Comment)  Discharge Needs  Concerns to be addressed:  Discharge Planning Concerns Readmission within the last 30 days:  No Current discharge risk:  Dependent with Mobility Barriers to Discharge:  Continued Medical Work up   [employer], [name], LCSW 10/27/2017, 1:48 PM

## 2017-10-27 NOTE — [place] FL2 (Signed)
[place] MEDICAID FL2 LEVEL OF CARE SCREENING TOOL     IDENTIFICATION  Patient Name: Priscilla Johnson Birthdate: [birth year] Sex: female Admission Date (Current Location): 10/26/2017  [place] and [place] Number:  Chiropodist and Address:  [facility], [address], [place], [place] [address]      Provider Number: [id number]  Attending Physician Name and Address:  Enedina Finner, MD  Relative Name and Phone Number:       Current Level of Care: Hospital Recommended Level of Care: Skilled Nursing Facility Prior Approval Number:    Date Approved/Denied:   PASRR Number: ([id number])  Discharge Plan: SNF    Current Diagnoses: Patient Active Problem List   Diagnosis Date Noted  . Toe fracture 10/26/2017  . Anemia 07/21/2016  . Advanced care planning/counseling discussion 07/20/2016  . Gout 07/20/2016  . Essential hypertension 08/23/2015  . Severe obesity (BMI 35.0-35.9 with comorbidity) (HCC) 04/18/2015  . Insomnia 01/16/2015  . Sleep apnea 01/16/2015  . Type II diabetes mellitus with renal manifestations (HCC) 01/16/2015  . Hypertensive CKD (chronic kidney disease) 01/16/2015  . Severe obesity (HCC) 01/16/2015  . CKD (chronic kidney disease), stage III (HCC) 01/16/2015  . Hyperlipidemia 01/16/2015    Orientation RESPIRATION BLADDER Height & Weight     Self, Time, Situation, Place  Normal Incontinent Weight: 259 lb 0.5 oz (117.5 kg) Height:  5\' 2"  (157.5 cm)  BEHAVIORAL SYMPTOMS/MOOD NEUROLOGICAL BOWEL NUTRITION STATUS      Continent Diet(Diet: Heart Healthy/ Carb Modified. )  AMBULATORY STATUS COMMUNICATION OF NEEDS Skin   Extensive Assist Verbally Other (Comment)(right elbow laceration. )                       Personal Care Assistance Level of Assistance  Bathing, Feeding, Dressing Bathing Assistance: Limited assistance Feeding assistance: Independent Dressing Assistance: Limited assistance     Functional Limitations  Info  Sight, Hearing, Speech Sight Info: Adequate Hearing Info: Adequate Speech Info: Adequate    SPECIAL CARE FACTORS FREQUENCY  PT (By licensed PT), OT (By licensed OT)     PT Frequency: (5) OT Frequency: (5)            Contractures      Additional Factors Info  Code Status, Allergies Code Status Info: (Full Code. ) Allergies Info: (No Known Allergies. )           Current Medications (10/27/2017):  This is the current hospital active medication list Current Facility-Administered Medications  Medication Dose Route Frequency Provider Last Rate Last Dose  . acetaminophen (TYLENOL) tablet 650 mg  650 mg Oral Q6H PRN Cammy Copa, MD       Or  . acetaminophen (TYLENOL) suppository 650 mg  650 mg Rectal Q6H PRN Cammy Copa, MD      . amLODipine (NORVASC) tablet 10 mg  10 mg Oral Daily Enedina Finner, MD   10 mg at 10/27/17 1211  . atenolol (TENORMIN) tablet 100 mg  100 mg Oral Daily Cammy Copa, MD   100 mg at 10/27/17 0858  . bisacodyl (DULCOLAX) EC tablet 5 mg  5 mg Oral Daily PRN Cammy Copa, MD      . docusate sodium (COLACE) capsule 100 mg  100 mg Oral BID Cammy Copa, MD   100 mg at 10/27/17 0859  . glipiZIDE (GLUCOTROL) tablet 5 mg  5 mg Oral BID Enedina Finner, MD   5 mg at 10/27/17 1211  . heparin injection 5,000 Units  5,000 Units  Subcutaneous Q8H Cammy CopaMaier, Angela, MD   5,000 Units at 10/27/17 [phone]  . lisinopril (PRINIVIL,ZESTRIL) tablet 20 mg  20 mg Oral Daily Enedina FinnerPatel, Sona, MD   20 mg at 10/27/17 1212   And  . hydrochlorothiazide (HYDRODIURIL) tablet 25 mg  25 mg Oral Daily Enedina FinnerPatel, Sona, MD   25 mg at 10/27/17 1211  . HYDROcodone-acetaminophen (NORCO/VICODIN) 5-325 MG per tablet 1-2 tablet  1-2 tablet Oral Q4H PRN Cammy CopaMaier, Angela, MD   1 tablet at 10/27/17 1227  . insulin aspart (novoLOG) injection 0-15 Units  0-15 Units Subcutaneous TID WC Cammy CopaMaier, Angela, MD   2 Units at 10/27/17 1209  . insulin aspart (novoLOG) injection 0-5 Units  0-5 Units Subcutaneous QHS Cammy CopaMaier,  Angela, MD      . metFORMIN (GLUCOPHAGE) tablet 500 mg  500 mg Oral BID Enedina FinnerPatel, Sona, MD   500 mg at 10/27/17 1209  . ondansetron (ZOFRAN) tablet 4 mg  4 mg Oral Q6H PRN Cammy CopaMaier, Angela, MD       Or  . ondansetron [facility](ZOFRAN) injection 4 mg  4 mg Intravenous Q6H PRN Cammy CopaMaier, Angela, MD      . pioglitazone (ACTOS) tablet 30 mg  30 mg Oral Daily Enedina FinnerPatel, Sona, MD   30 mg at 10/27/17 1211  . traZODone (DESYREL) tablet 25 mg  25 mg Oral QHS PRN Cammy CopaMaier, Angela, MD   25 mg at 10/27/17 0029     Discharge Medications: Please see discharge summary for a list of discharge medications.  Relevant Imaging Results:  Relevant Lab Results:   Additional Information (SSN: [id number])  Moroni Nester, Darleen CrockerBailey M, LCSW

## 2017-10-27 NOTE — Evaluation (Signed)
Physical Therapy Evaluation Patient Details Name: Priscilla Johnson MRN: [id number] DOB: 1945-09-26 Today's Date: 10/27/2017   History of Present Illness  presented to ER secondary to recurrent falls; admitted with R toe fracture.  Clinical Impression  Upon evaluation, patient alert and oriented; follows commands, demonstrates fair effort with all mobiltiy tasks.  Generally anxious and fearful of mobility, requiring constant cuing/encouragement for participation and full, active effort with all mobility tasks.  Globally weak and deconditioned, requiring physical assist for movement of all extremities.  Did demonstrate single, spontaneous posterior LOB with unsupported sitting (unrelated to position, movement or symptom), dep assist to maintain safety and recover.  Currently requiring mod/max assist +1-2 for bed mobility; mod/max assist +2 for sit/stand and static stance with RW.  Poor ability to maintain full upright posture or to maintain position for functional amount of time.  Often attempts spontaneous sitting with perceived LOB or weakness; dep cuing for continued effort with standing tasks.  Does report dizziness with transition to upright, though unable to maintain standing for full BP assessment (see vitals flowsheet).  Unsafe to attempt any mobility without heavy +2 assist; very high risk for falls. Would benefit from skilled PT to address above deficits and promote optimal return to PLOF; recommend transition to STR upon discharge from acute hospitalization.     Follow Up Recommendations SNF    Equipment Recommendations       Recommendations for Other Services       Precautions / Restrictions Precautions Precautions: Fall Precaution Comments: post-op shoe to R foot Restrictions Weight Bearing Restrictions: No      Mobility  Bed Mobility Overal bed mobility: Needs Assistance Bed Mobility: Supine to Sit;Sit to Supine     Supine to sit: Mod assist;Max assist Sit to supine:  Max assist;+2 for physical assistance   General bed mobility comments: physical assist for movement of all extremities and elevation of trunk; guarded active effort by patient  Transfers Overall transfer level: Needs assistance Equipment used: Rolling walker (2 wheeled) Transfers: Sit to/from Stand Sit to Stand: Mod assist;Max assist;+2 physical assistance         General transfer comment: extensive assist for lift off and static standing balance; unable to achieve full, upright stance, maintaining forward trunk flexion, posterior weight shift.  Attempts spontaneous sitting vs balance/postural correction.  Very high risk for falls.  Ambulation/Gait             General Gait Details: unsafe/unable  Stairs            Wheelchair Mobility    Modified Rankin (Stroke Patients Only)       Balance Overall balance assessment: Needs assistance Sitting-balance support: Feet supported;No upper extremity supported Sitting balance-Leahy Scale: Fair Sitting balance - Comments: single episode of complete posterior LOB while sitting edge of bed (unrelated to any activity/position/symptom), dep to maintain patient safety   Standing balance support: Bilateral upper extremity supported Standing balance-Leahy Scale: Zero                               Pertinent Vitals/Pain Pain Assessment: Faces Faces Pain Scale: No hurt    Home Living Family/patient expects to be discharged to:: Private residence Living Arrangements: Spouse/significant other Available Help at Discharge: Family Type of Home: Mobile home Home Access: Ramped entrance     Home Layout: One level Home Equipment: Environmental consultantWalker - 2 wheels;Walker - 4 wheels      Prior Function Level of  Independence: Independent with assistive device(s)         Comments: Mod indep with RW for limited household mobility; sponge bathes for ADLs; husband assists with household chores as needed.  Does endorse at least 3 falls  within previous six months (at least 2 in previous week).     Hand Dominance        Extremity/Trunk Assessment   Upper Extremity Assessment Upper Extremity Assessment: Generalized weakness    Lower Extremity Assessment Lower Extremity Assessment: Generalized weakness(grossly 3-/5 throughout)       Communication   Communication: No difficulties  Cognition Arousal/Alertness: Awake/alert Behavior During Therapy: Anxious Overall Cognitive Status: Within Functional Limits for tasks assessed                                        General Comments      Exercises Other Exercises Other Exercises: Applied and introduced use of R post-op shoe to protect R foot/toe. Other Exercises: Sit/stand x3 with RW, mod/max assist +2 for lift off, standing balance and overall safety.  Constant cuing/encouragement to maintain position.   Assessment/Plan    PT Assessment Patient needs continued PT services  PT Problem List Decreased strength;Decreased range of motion;Decreased activity tolerance;Decreased balance;Decreased mobility;Decreased coordination;Decreased cognition;Decreased knowledge of use of DME;Decreased safety awareness;Decreased knowledge of precautions;Cardiopulmonary status limiting activity;Obesity       PT Treatment Interventions DME instruction;Gait training;Functional mobility training;Therapeutic activities;Therapeutic exercise;Balance training;Patient/family education    PT Goals (Current goals can be found in the Care Plan section)  Acute Rehab PT Goals Patient Stated Goal: to lay back down PT Goal Formulation: With patient Time For Goal Achievement: 11/10/17 Potential to Achieve Goals: Fair    Frequency Min 2X/week   Barriers to discharge Decreased caregiver support      Co-evaluation               AM-PAC PT "6 Clicks" Daily Activity  Outcome Measure Difficulty turning over in bed (including adjusting bedclothes, sheets and blankets)?:  Unable Difficulty moving from lying on back to sitting on the side of the bed? : Unable Difficulty sitting down on and standing up from a chair with arms (e.g., wheelchair, bedside commode, etc,.)?: Unable Help needed moving to and from a bed to chair (including a wheelchair)?: Total Help needed walking in hospital room?: Total Help needed climbing 3-5 steps with a railing? : Total 6 Click Score: 6    End of Session Equipment Utilized During Treatment: Gait belt Activity Tolerance: (limited by dizziness, anxiety, fear of falling) Patient left: in bed;with bed alarm set;with call bell/phone within reach;with family/visitor present Nurse Communication: Mobility status PT Visit Diagnosis: Unsteadiness on feet (R26.81);Repeated falls (R29.6);Muscle weakness (generalized) (M62.81);Difficulty in walking, not elsewhere classified (R26.2)    Time: 5366-4403 PT Time Calculation (min) (ACUTE ONLY): 32 min   Charges:   PT Evaluation $PT Eval Moderate Complexity: 1 Mod PT Treatments $Therapeutic Activity: 8-22 mins   PT G Codes:       Priscilla Johnson, PT, DPT, NCS 10/27/17, 11:00 AM [phone]

## 2017-10-27 NOTE — Progress Notes (Signed)
[facility] [place] [facility]   PATIENT NAME: Priscilla Johnson    MR#:  [id number]  DATE OF BIRTH:  05/19/1945  SUBJECTIVE:   Patient came in with weakness and a few falls since Monday at home. Fractured her right great toe. Feels her legs are buckling and she loses balance. He was at home with her husband REVIEW OF SYSTEMS:   Review of Systems  Constitutional: Negative for chills, fever and weight loss.  HENT: Negative for ear discharge, ear pain and nosebleeds.   Eyes: Negative for blurred vision, pain and discharge.  Respiratory: Negative for sputum production, shortness of breath, wheezing and stridor.   Cardiovascular: Negative for chest pain, palpitations, orthopnea and PND.  Gastrointestinal: Negative for abdominal pain, diarrhea, nausea and vomiting.  Genitourinary: Negative for frequency and urgency.  Musculoskeletal: Negative for back pain and joint pain.  Neurological: Negative for sensory change, speech change, focal weakness and weakness.  Psychiatric/Behavioral: Negative for depression and hallucinations. The patient is not nervous/anxious.    Tolerating Diet:yes Tolerating PT: rehab--2 person assist and HIGH risk for fall  DRUG ALLERGIES:  No Known Allergies  VITALS:  Blood pressure (!) 151/45, pulse 87, temperature 98.2 F (36.8 C), temperature source Oral, resp. rate 18, height 5\' 2"  (1.575 m), weight 117.5 kg (259 lb 0.5 oz), SpO2 94 %.  PHYSICAL EXAMINATION:   Physical Exam  GENERAL:  72 y.o.-year-old patient lying in the bed with no acute distress. obese EYES: Pupils equal, round, reactive to light and accommodation. No scleral icterus. Extraocular muscles intact.  HEENT: Head atraumatic, normocephalic. Oropharynx and nasopharynx clear.  NECK:  Supple, no jugular venous distention. No thyroid enlargement, no tenderness.  LUNGS: Normal breath sounds bilaterally, no wheezing, rales, rhonchi. No use of accessory muscles of  respiration.  CARDIOVASCULAR: S1, S2 normal. No murmurs, rubs, or gallops.  ABDOMEN: Soft, nontender, nondistended. Bowel sounds present. No organomegaly or mass.  EXTREMITIES: No cyanosis, clubbing or edema b/l.    NEUROLOGIC: Cranial nerves II through XII are intact. No focal Motor or sensory deficits b/l.  Rest tremors PSYCHIATRIC:  patient is alert and oriented x 3.  SKIN: No obvious rash, lesion, or ulcer.   LABORATORY PANEL:  CBC Recent Labs  Lab 10/27/17 0612  WBC 6.7  HGB 10.3*  HCT 31.1*  PLT 209    Chemistries  Recent Labs  Lab 10/27/17 0612  NA 139  K 3.7  CL 104  CO2 28  GLUCOSE 154*  BUN 21  CREATININE 0.76  CALCIUM 8.4*   Cardiac Enzymes Recent Labs  Lab 10/26/17 1831  TROPONINI <0.03   RADIOLOGY:  Dg Elbow Complete Right  Result Date: 10/26/2017 CLINICAL DATA:  Recurrent falls over the past week. Right elbow pain. Initial encounter. EXAM: RIGHT ELBOW - COMPLETE 3+ VIEW COMPARISON:  None. FINDINGS: There is no evidence of fracture, dislocation, or joint effusion. There is some degenerative disease about the elbow. Soft tissues are unremarkable. IMPRESSION: No acute abnormality. Electronically Signed   By: Drusilla Kannerhomas  Dalessio M.D.   On: 10/26/2017 19:29   Ct Head Wo Contrast  Result Date: 10/26/2017 CLINICAL DATA:  Fall.  Dizziness. EXAM: CT HEAD WITHOUT CONTRAST TECHNIQUE: Contiguous axial images were obtained from the base of the skull through the vertex without intravenous contrast. COMPARISON:  None. FINDINGS: Brain: No subdural, epidural, or subarachnoid hemorrhage. Cerebellum, brainstem, and basal cisterns are normal. Ventricles and sulci are unremarkable. No mass effect or midline shift. No acute cortical ischemia or infarct.  Vascular: Calcified atherosclerosis in the intracranial carotids. Skull: Normal. Negative for fracture or focal lesion. Sinuses/Orbits: No acute finding. Other: None. IMPRESSION: No acute intracranial abnormalities. Electronically  Signed   By: Gerome Sam III M.D   On: 10/26/2017 19:26   Mr Brain Wo Contrast  Result Date: 10/26/2017 CLINICAL DATA:  LEFT leg weakness, recurrent falls. Gait instability for several months. History of hypertension, hyperlipidemia, diabetes. EXAM: MRI HEAD WITHOUT CONTRAST TECHNIQUE: Multiplanar, multiecho pulse sequences of the brain and surrounding structures were obtained without intravenous contrast. COMPARISON:  CT HEAD October 26, 2017 FINDINGS: INTRACRANIAL CONTENTS: No reduced diffusion to suggest acute ischemia. No susceptibility artifact to suggest hemorrhage. The ventricles and sulci are normal for patient's age. A few scattered subcentimeter supratentorial white matter FLAIR T2 hyperintensities compatible with mild chronic small vessel ischemic changes, less than expected for age. No suspicious parenchymal signal, masses, mass effect. No abnormal extra-axial fluid collections. No extra-axial masses. VASCULAR: Normal major intracranial vascular flow voids present at skull base. SKULL AND UPPER CERVICAL SPINE: No abnormal sellar expansion. No suspicious calvarial bone marrow signal. Hyperostosis frontalis internus. Craniocervical junction maintained. SINUSES/ORBITS: The mastoid air-cells and included paranasal sinuses are well-aerated.The included ocular globes and orbital contents are non-suspicious. OTHER: Patient is edentulous. IMPRESSION: Normal noncontrast MRI head for age. Electronically Signed   By: Awilda Metro M.D.   On: 10/26/2017 23:56   Dg Toe Great Right  Result Date: 10/26/2017 CLINICAL DATA:  Right great toe pain. Recurrent falls over the past week. Initial encounter. EXAM: RIGHT GREAT TOE COMPARISON:  Plain films right foot 07/10/2014. FINDINGS: The patient has a nondisplaced fracture through the base of the proximal phalanx of the right great toe. The fracture extends to the lateral corner of the articular surface at the first MTP joint. No other acute abnormality is  identified. IMPRESSION: Nondisplaced fracture base of the proximal phalanx of the great toe. Electronically Signed   By: Drusilla Kanner M.D.   On: 10/26/2017 19:31   ASSESSMENT AND PLAN:   Samamtha Tiegs  is a 72 y.o. female with a known history of diabetes type 2, hypertension, hyperlipidemia, morbid obesity and sleep apnea. Patient presented to emergency room for recurrent mechanical falls.  She feels that her legs give out on her, especially the left leg.  Patient states that she is using a walker, but continues to have falls.  She denies any pain or numbness in her lower extremities   1.  Recurrent falls, secondary to unstable gait.  Will have PT/OT evaluate and treat the patient. -Appreciate physical therapy evaluation. Patient is at a very high risk for falls. She has had several falls at home. She is a two person assist. Lives at home with his husband. Benefit from rehab. She is agreeable to it since she is not able to manage herself given falls and ability. -Case manager for discharge planning -no source of infection  2. Unstable gait, likely multifactorial, related to deconditioning, osteoarthritis and possibly orthostatic hypotension. - adjusting blood pressure meds  -3.  Right great toe fracture, status post mechanical fall.  -  Patient should wear flat soled shoes, for few weeks until healing is complete. -s/p splinting  4.  Right elbow laceration, status post mechanical fall.  This was sutured in the emergency room.  5.  Hypertension, stable.  Will adjust medications as mentioned above under #2.  6.  Diabetes type 2.  Will start low-carb diet and monitor blood sugars before meals and at bedtime.  Will  use insulin treatment during the hospital stay.  CSW for d/c planning   Case discussed with Care Management/Social Worker. Management plans discussed with the patient, family and they are in agreement.  CODE STATUS: full  DVT Prophylaxis: lovenox  TOTAL TIME TAKING  CARE OF THIS PATIENT: *30* minutes.  >50% time spent on counselling and coordination of care  POSSIBLE D/C IN *1-2* DAYS, DEPENDING ON CLINICAL CONDITION.  Note: This dictation was prepared with Dragon dictation along with smaller phrase technology. Any transcriptional errors that result from this process are unintentional.  Enedina Finner M.D on 10/27/2017 at 1:24 PM  Between 7am to 6pm - Pager - [phone]  After 6pm go to [email or web address] - Social research officer, government  [facility]  Office  [phone]  CC: Primary care physician; Gabriel Cirri, NPPatient ID: Priscilla Johnson, female   DOB: Sep 29, 1945, 72 y.o.   MRN: [id number]

## 2017-10-27 NOTE — Consult Note (Signed)
WOC Nurse wound consult note Reason for Consult:Intertriginous dermatitis to abdominal pannus, present on admission.  She has been treating with wet wipes, she reports.  Wound type:Moisture associated skin damage. (intertriginous dermatitis) Pressure Injury POA: NA Measurement: Erythema and denuded skin to abdominal pannus, weeping and tender Wound ZOX:WRUEbed:pink and moist Drainage (amount, consistency, odor) scant weeping  No odor Periwound:intact  Dressing procedure/placement/frequency: Will begin skin fold management linen to abdominal pannus:  Measure and cut length of InterDry Ag+ to fit in skin folds that have skin breakdown Tuck InterDry  Ag+ fabric into skin folds in a single layer, allow for 2 inches of overhang from skin edges to allow for wicking to occur May remove to bathe; dry area thoroughly and then tuck into affected areas again Do not apply any creams or ointments when using InterDry Ag+ DO NOT THROW AWAY FOR 5 DAYS unless soiled with stool DO NOT WASH product, this will inactivate the silver in the material  New sheet of Interdry Ag+ should be applied after 5 days of use if patient continues to have skin breakdown   Will not follow at this time.  Please re-consult if needed.  Maple HudsonKaren Jett Fukuda RN BSN CWON Pager [phone]

## 2017-10-27 NOTE — Progress Notes (Signed)
Clinical Education officer, museum (CSW) met with patient and presented bed offers. Patient chose [facility]. Per [facility] admissions coordinator at [facility] patient can have a private room and she will start [facility] authorization today.   [employer], LCSW [phone]

## 2017-10-27 NOTE — Clinical Social Work Placement (Signed)
   CLINICAL SOCIAL WORK PLACEMENT  NOTE  Date:  10/27/2017  Patient Details  Name: Priscilla Johnson MRN: [id number] Date of Birth: Aug 31, 1945  Clinical Social Work is seeking post-discharge placement for this patient at the Skilled  Nursing Facility level of care (*CSW will initial, date and re-position this form in  chart as items are completed):  Yes   Patient/family provided with [place] Clinical Social Work Department's list of facilities offering this level of care within the geographic area requested by the patient (or if unable, by the patient's family).  Yes   Patient/family informed of their freedom to choose among providers that offer the needed level of care, that participate in Medicare, Medicaid or managed care program needed by the patient, have an available bed and are willing to accept the patient.  Yes   Patient/family informed of [facility] ownership interest [facility] and [facility], as well as of the fact that they are under no obligation to receive care at these facilities.  PASRR submitted to EDS on 10/27/17     PASRR number received on 10/27/17     Existing PASRR number confirmed on       FL2 transmitted to all facilities in geographic area requested by pt/family on 10/27/17     FL2 transmitted to all facilities within larger geographic area on       Patient informed that his/her managed care company has contracts with or will negotiate with certain facilities, including the following:            Patient/family informed of bed offers received.  Patient chooses bed at       Physician recommends and patient chooses bed at      Patient to be transferred to   on  .  Patient to be transferred to facility by       Patient family notified on   of transfer.  Name of family member notified:        PHYSICIAN       Additional Comment:    _______________________________________________ Nichola Cieslinski, Darleen CrockerBailey M, LCSW 10/27/2017, 1:47 PM

## 2017-10-27 NOTE — Evaluation (Signed)
Occupational Therapy Evaluation Patient Details Name: Priscilla Johnson MRN: [id number] DOB: April 25, 1945 Today's Date: 10/27/2017    History of Present Illness 71yo female pt presented to ER secondary to recurrent falls; admitted with R toe fracture.   Clinical Impression   Pt seen for OT evaluation this date. Prior to hospital admission, pt was ambulating very short HH distances, able to stand with <5 min at a time. Pt was taking sponge baths and required assist for LB dressing occasionally (didn't wear socks/shoes generally, sometimes wears underwear, and typically will wear a house dress to minimize need to perform LB dressing). Pt indep with med mgt, and spouse assists with transportation, chores, and meals.  Pt lives  In a 1 story home with a ramped entrance. Currently pt demonstrates impairments in strength, activity tolerance, pain in B feet (R>L), and mildly anxious/fearful of falls requiring total assist for LB ADL from bed level, max assist for rolling in bed. Pt introduced to energy conservation principles and DME that would improve pt's safety and independence with toilet transfers and LB dressing.  Pt would benefit from skilled OT to address noted impairments and functional limitations (see below for any additional details) in order to maximize safety and independence while minimizing falls risk and caregiver burden.  Upon hospital discharge, recommend pt discharge to STR.     Follow Up Recommendations  SNF    Equipment Recommendations  3 in 1 bedside commode;Toilet rise with handles;Other (comment)(bariatric 3:1)    Recommendations for Other Services       Precautions / Restrictions Precautions Precautions: Fall Precaution Comments: post-op shoe to R foot Restrictions Weight Bearing Restrictions: No      Mobility Bed Mobility Overal bed mobility: Needs Assistance Bed Mobility: Rolling Rolling: Max assist            Transfers                       Balance                                           ADL either performed or assessed with clinical judgement   ADL Overall ADL's : Needs assistance/impaired Eating/Feeding: Bed level;Independent   Grooming: Bed level;Independent   Upper Body Bathing: Bed level;Maximal assistance   Lower Body Bathing: Bed level;Total assistance   Upper Body Dressing : Bed level;Maximal assistance   Lower Body Dressing: Bed level;Total assistance   Toilet Transfer: Total assistance Toilet Transfer Details (indicate cue type and reason): bed level Toileting- Clothing Manipulation and Hygiene: Total assistance               Vision Baseline Vision/History: Wears glasses Wears Glasses: At all times Patient Visual Report: No change from baseline Vision Assessment?: No apparent visual deficits     Perception     Praxis      Pertinent Vitals/Pain Pain Assessment: 0-10 Pain Score: 3  Pain Descriptors / Indicators: Aching Pain Intervention(s): Limited activity within patient's tolerance;Monitored during session     Hand Dominance     Extremity/Trunk Assessment Upper Extremity Assessment Upper Extremity Assessment: Generalized weakness   Lower Extremity Assessment Lower Extremity Assessment: Generalized weakness(decreased DF in B ankles)       Communication Communication Communication: No difficulties   Cognition Arousal/Alertness: Awake/alert Behavior During Therapy: WFL for tasks assessed/performed Overall Cognitive Status: Within Functional Limits for tasks assessed  General Comments       Exercises Other Exercises Other Exercises: Pt introduced to energy conservation principles and DME/AE that would improve pt's safety and independence with toileting, bathing, and dressing as well as improving functional transfers by elevating sitting surfaces   Shoulder Instructions      Home Living Family/patient  expects to be discharged to:: Private residence Living Arrangements: Spouse/significant other Available Help at Discharge: Family;Available PRN/intermittently(spouse unable to physically assist if pt falls to floor) Type of Home: Mobile home Home Access: Ramped entrance     Home Layout: One level     Bathroom Shower/Tub: Chief Strategy Officer: Standard     Home Equipment: Environmental consultant - 2 wheels;Walker - 4 wheels;Adaptive equipment Adaptive Equipment: Reacher        Prior Functioning/Environment Level of Independence: Independent with assistive device(s)        Comments: Mod indep with RW for limited household mobility (states able to stand <57min at a time); sponge bathes for ADLs; husband assists with household chores as needed. Pt indep with med mgt. Does endorse at least 3 falls within previous six months (at least 2 in previous week) 2/2 LOB/legs giving out         OT Problem List: Decreased strength;Decreased knowledge of use of DME or AE;Decreased range of motion;Decreased activity tolerance;Obesity;Impaired balance (sitting and/or standing);Pain      OT Treatment/Interventions: Self-care/ADL training;Balance training;Therapeutic exercise;Therapeutic activities;Energy conservation;DME and/or AE instruction;Patient/family education    OT Goals(Current goals can be found in the care plan section) Acute Rehab OT Goals Patient Stated Goal: to get better OT Goal Formulation: With patient Time For Goal Achievement: 11/10/17 Potential to Achieve Goals: Good ADL Goals Pt Will Perform Lower Body Dressing: with mod assist;with adaptive equipment;sit to/from stand(mod A for transitional movement, LRAD when in standing) Pt Will Transfer to Toilet: with mod assist;bedside commode;stand pivot transfer(LRAD for amb, bari BSC) Additional ADL Goal #1: Pt will verbalize plan for implementing at least 1 learned falls prevention strategy to maximize safety/independence in the  home. Additional ADL Goal #2: Pt will verbalize plan for implementing at least 1 learned energy conservation strategy to maximize safety/independence in the home.  OT Frequency: Min 2X/week   Barriers to D/C: Decreased caregiver support;Inaccessible home environment          Co-evaluation              AM-PAC PT "6 Clicks" Daily Activity     Outcome Measure Help from another person eating meals?: None Help from another person taking care of personal grooming?: None Help from another person toileting, which includes using toliet, bedpan, or urinal?: Total Help from another person bathing (including washing, rinsing, drying)?: Total Help from another person to put on and taking off regular upper body clothing?: Total Help from another person to put on and taking off regular lower body clothing?: Total 6 Click Score: 12   End of Session    Activity Tolerance: Patient limited by fatigue Patient left: in bed;with call bell/phone within reach;with bed alarm set  OT Visit Diagnosis: Other abnormalities of gait and mobility (R26.89);Muscle weakness (generalized) (M62.81);History of falling (Z91.81);Pain Pain - Right/Left: Right(both, R>L) Pain - part of body: Ankle and joints of foot                Time: 1610-9604 OT Time Calculation (min): 17 min Charges:  OT General Charges $OT Visit: 1 Visit OT Evaluation $OT Eval Low Complexity: 1 Low  Richrd Prime, MPH,  MS, OTR/L ascom [phone] 10/27/17, 3:00 PM

## 2017-10-28 ENCOUNTER — Inpatient Hospital Stay: Payer: Medicare Other

## 2017-10-28 LAB — URINALYSIS, COMPLETE (UACMP) WITH MICROSCOPIC
BILIRUBIN URINE: NEGATIVE
Glucose, UA: NEGATIVE mg/dL
Ketones, ur: NEGATIVE mg/dL
Nitrite: POSITIVE — AB
PH: 5 (ref 5.0–8.0)
Protein, ur: 30 mg/dL — AB
SPECIFIC GRAVITY, URINE: 1.029 (ref 1.005–1.030)
WBC, UA: >50 WBC/hpf — ABNORMAL HIGH (ref 0–5)

## 2017-10-28 LAB — GLUCOSE, CAPILLARY
GLUCOSE-CAPILLARY: 105 mg/dL — AB (ref 70–99)
GLUCOSE-CAPILLARY: 163 mg/dL — AB (ref 70–99)
Glucose-Capillary: 74 mg/dL (ref 70–99)
Glucose-Capillary: 140 mg/dL — ABNORMAL HIGH (ref 70–99)

## 2017-10-28 LAB — URINE CULTURE: Culture: NO GROWTH

## 2017-10-28 MED ORDER — SODIUM CHLORIDE 0.9 % IV SOLN
INTRAVENOUS | Status: DC
  Administered 2017-10-28 – 2017-10-30 (×5): via INTRAVENOUS

## 2017-10-28 MED ORDER — IBUPROFEN 400 MG PO TABS
400.0000 mg | ORAL_TABLET | ORAL | Status: AC
  Administered 2017-10-28: 400 mg via ORAL
  Filled 2017-10-28: qty 1

## 2017-10-28 NOTE — Progress Notes (Signed)
[facility] [place] [facility]   PATIENT NAME: Priscilla Johnson    MR#:  [id number]  DATE OF BIRTH:  [birth year]  SUBJECTIVE:  CHIEF COMPLAINT:   Chief Complaint  Patient presents with  . Fall  . Weakness   -Complains of weakness.  Spiking fever since last night.  REVIEW OF SYSTEMS:  Review of Systems  Constitutional: Positive for fever and malaise/fatigue. Negative for chills.  HENT: Negative for congestion, ear discharge, hearing loss and nosebleeds.   Eyes: Negative for blurred vision and double vision.  Respiratory: Negative for cough, shortness of breath and wheezing.   Cardiovascular: Negative for chest pain and palpitations.  Gastrointestinal: Positive for constipation. Negative for abdominal pain, diarrhea, nausea and vomiting.  Genitourinary: Negative for dysuria.  Musculoskeletal: Positive for myalgias.  Neurological: Negative for dizziness, focal weakness, seizures, weakness and headaches.  Psychiatric/Behavioral: Negative for depression.    DRUG ALLERGIES:  No Known Allergies  VITALS:  Blood pressure (!) 128/32, pulse 71, temperature (!) 101.8 F (38.8 C), temperature source Oral, resp. rate 18, height 5\' 2"  (1.575 m), weight 114.3 kg (252 lb), SpO2 91 %.  PHYSICAL EXAMINATION:  Physical Exam  GENERAL:  72 y.o.-year-old obese patient lying in the bed with no acute distress.  EYES: Pupils equal, round, reactive to light and accommodation. No scleral icterus. Extraocular muscles intact.  HEENT: Head atraumatic, normocephalic. Oropharynx and nasopharynx clear.  NECK:  Supple, no jugular venous distention. No thyroid enlargement, no tenderness.  LUNGS: Normal breath sounds bilaterally, no wheezing, rales,rhonchi or crepitation. No use of accessory muscles of respiration.  Decreased bibasilar breath sounds. CARDIOVASCULAR: S1, S2 normal. No  rubs, or gallops.  2/6 systolic murmur is ABDOMEN: Soft, nontender, nondistended. Bowel sounds  present. No organomegaly or mass.  EXTREMITIES: Right foot great toe taped.  Bruising around left foot great toe noted no pedal edema, cyanosis, or clubbing.  NEUROLOGIC: Cranial nerves II through XII are intact. Muscle strength 5/5 in all extremities. Sensation intact. Gait not checked.  Global weakness present PSYCHIATRIC: The patient is alert and oriented x 3.  SKIN: No obvious rash, lesion, or ulcer.    LABORATORY PANEL:   CBC Recent Labs  Lab 10/27/17 0612  WBC 6.7  HGB 10.3*  HCT 31.1*  PLT 209   ------------------------------------------------------------------------------------------------------------------  Chemistries  Recent Labs  Lab 10/27/17 0612  NA 139  K 3.7  CL 104  CO2 28  GLUCOSE 154*  BUN 21  CREATININE 0.76  CALCIUM 8.4*   ------------------------------------------------------------------------------------------------------------------  Cardiac Enzymes Recent Labs  Lab 10/26/17 1831  TROPONINI <0.03   ------------------------------------------------------------------------------------------------------------------  RADIOLOGY:  Dg Chest 2 View  Result Date: 10/28/2017 CLINICAL DATA:  Fever EXAM: CHEST - 2 VIEW COMPARISON:  None. FINDINGS: There is bilateral interstitial thickening and prominence of the central pulmonary vasculature. There is no focal consolidation. There is no pleural effusion or pneumothorax. There is stable cardiomegaly. The osseous structures are unremarkable. IMPRESSION: Cardiomegaly with mild pulmonary vascular congestion. Electronically Signed   By: Elige Ko   On: 10/28/2017 11:36   Dg Elbow Complete Right  Result Date: 10/26/2017 CLINICAL DATA:  Recurrent falls over the past week. Right elbow pain. Initial encounter. EXAM: RIGHT ELBOW - COMPLETE 3+ VIEW COMPARISON:  None. FINDINGS: There is no evidence of fracture, dislocation, or joint effusion. There is some degenerative disease about the elbow. Soft tissues are  unremarkable. IMPRESSION: No acute abnormality. Electronically Signed   By: Drusilla Kanner M.D.   On: 10/26/2017 19:29  Ct Head Wo Contrast  Result Date: 10/26/2017 CLINICAL DATA:  Fall.  Dizziness. EXAM: CT HEAD WITHOUT CONTRAST TECHNIQUE: Contiguous axial images were obtained from the base of the skull through the vertex without intravenous contrast. COMPARISON:  None. FINDINGS: Brain: No subdural, epidural, or subarachnoid hemorrhage. Cerebellum, brainstem, and basal cisterns are normal. Ventricles and sulci are unremarkable. No mass effect or midline shift. No acute cortical ischemia or infarct. Vascular: Calcified atherosclerosis in the intracranial carotids. Skull: Normal. Negative for fracture or focal lesion. Sinuses/Orbits: No acute finding. Other: None. IMPRESSION: No acute intracranial abnormalities. Electronically Signed   By: Gerome Sam III M.D   On: 10/26/2017 19:26   Mr Brain Wo Contrast  Result Date: 10/26/2017 CLINICAL DATA:  LEFT leg weakness, recurrent falls. Gait instability for several months. History of hypertension, hyperlipidemia, diabetes. EXAM: MRI HEAD WITHOUT CONTRAST TECHNIQUE: Multiplanar, multiecho pulse sequences of the brain and surrounding structures were obtained without intravenous contrast. COMPARISON:  CT HEAD October 26, 2017 FINDINGS: INTRACRANIAL CONTENTS: No reduced diffusion to suggest acute ischemia. No susceptibility artifact to suggest hemorrhage. The ventricles and sulci are normal for patient's age. A few scattered subcentimeter supratentorial white matter FLAIR T2 hyperintensities compatible with mild chronic small vessel ischemic changes, less than expected for age. No suspicious parenchymal signal, masses, mass effect. No abnormal extra-axial fluid collections. No extra-axial masses. VASCULAR: Normal major intracranial vascular flow voids present at skull base. SKULL AND UPPER CERVICAL SPINE: No abnormal sellar expansion. No suspicious calvarial bone  marrow signal. Hyperostosis frontalis internus. Craniocervical junction maintained. SINUSES/ORBITS: The mastoid air-cells and included paranasal sinuses are well-aerated.The included ocular globes and orbital contents are non-suspicious. OTHER: Patient is edentulous. IMPRESSION: Normal noncontrast MRI head for age. Electronically Signed   By: Awilda Metro M.D.   On: 10/26/2017 23:56   Dg Toe Great Right  Result Date: 10/26/2017 CLINICAL DATA:  Right great toe pain. Recurrent falls over the past week. Initial encounter. EXAM: RIGHT GREAT TOE COMPARISON:  Plain films right foot 07/10/2014. FINDINGS: The patient has a nondisplaced fracture through the base of the proximal phalanx of the right great toe. The fracture extends to the lateral corner of the articular surface at the first MTP joint. No other acute abnormality is identified. IMPRESSION: Nondisplaced fracture base of the proximal phalanx of the great toe. Electronically Signed   By: Drusilla Kanner M.D.   On: 10/26/2017 19:31    EKG:   Orders placed or performed during the hospital encounter of 10/26/17  . ED EKG  . ED EKG  . EKG 12-Lead  . EKG 12-Lead    ASSESSMENT AND PLAN:   72 year old female with morbid obesity, sleep apnea, diabetes, hypertension presents to hospital secondary to recurrent falls.  1.  Recurrent falls-ataxia and unsteady gait. -CT head negative for any acute neurological reasons.  PT and OT recommended rehab at this time. -Could be from orthostatic hypotension.  Blood pressure medications have been adjusted  2.  Fevers-repeat urine analysis, blood cultures have been ordered.  Could be viral -Also follow-up chest x-ray -Hold off on antibiotics at this time.  3.  Right great toe fracture after mechanical fall-status post splinting and tape.  Flat soled shoes recommended for now  4.  Diabetes-on glipizide, metformin, Actos and sliding scale insulin  5.  Hypertension-on lisinopril -Continue  hydrochlorothiazide  6.  DVT prophylaxis-subcutaneous heparin  Patient has abated [facility].  Once fevers are resolved, can be discharged     All the records are reviewed  and case discussed with Care Management/Social Workerr. Management plans discussed with the patient, family and they are in agreement.  CODE STATUS: Full code  TOTAL TIME TAKING CARE OF THIS PATIENT: 38 minutes.   POSSIBLE D/C IN 1-2 DAYS, DEPENDING ON CLINICAL CONDITION.   Enid BaasKALISETTI,Ivelisse Culverhouse M.D on 10/28/2017 at 12:22 PM  Between 7am to 6pm - Pager - [phone]  After 6pm go to [email or web address] - Social research officer, governmentpassword EPAS [facility]  [facility]  Office  [phone]  CC: Primary care physician; [name], [name], NP

## 2017-10-28 NOTE — Progress Notes (Signed)
Inpatient Diabetes Program Recommendations  AACE/ADA: New Consensus Statement on Inpatient Glycemic Control (2015)  Target Ranges:  Prepandial:   less than 140 mg/dL      Peak postprandial:   less than 180 mg/dL (1-2 hours)      Critically ill patients:  140 - 180 mg/dL   Results for Priscilla Johnson, Priscilla Johnson (MRN [id number]) as of 10/28/2017 10:50  Ref. Range 10/27/2017 07:51 10/27/2017 11:36 10/27/2017 16:49 10/27/2017 17:22 10/27/2017 21:36 10/28/2017 07:38  Glucose-Capillary Latest Ref Range: 70 - 99 mg/dL 979129 (H) 892134 (H) 67 (L) 98 101 (H) 74   Review of Glycemic Control  Diabetes history: DM 2 Outpatient Diabetes medications: Glipizide 5 mg BID, Metformin 500 mg BID, Actos 30 mg Daily Current orders for Inpatient glycemic control: Novolog 0-15 units tid, Novolog 0-5 units qhs, Glipizide 5 mg BID, Metformin 500 mg BID, Actos 30 mg Daily   Inpatient Diabetes Program Recommendations:    Patient had hypoglycemia yesterday. Patient is on sulfonylurea, know to cause hypo. Please consider d/cing glipizide for now.  Thanks,  Christena DeemShannon Kilyn Maragh RN, MSN, BC-ADM, [facility] Inpatient Diabetes Coordinator Team Pager [phone] (8a-5p)

## 2017-10-28 NOTE — Progress Notes (Signed)
Occupational Therapy Treatment Patient Details Name: Priscilla Johnson MRN: [id number] DOB: 03/20/1946 Today's Date: 10/28/2017    History of present illness Pt. is a 72 yo female who presented to ER secondary to recurrent falls, and was admitted to [facility] with a R toe fracture.   OT comments  Pt. just returned from x-ray. Pt. has c/o of right knee pain. Pt., and famliy education was provided about energy conservation, work simplification techniques for ADL tasks, and home management. Pt. Education was provided about A/E, and DME. Pt. continues to benefit from OT services for ADL training, A/E training, and pt. education about energy conservation, and work simplification techniques, home modification, and DME. Pt. continues to benefit from SNF level of care upon discharge with follow-up OT services recommended.   Follow Up Recommendations  SNF    Equipment Recommendations  3 in 1 bedside commode;Toilet rise with handles;Other (comment);Tub/shower bench(Transfer tub bench)    Recommendations for Other Services      Precautions / Restrictions Precautions Precautions: Fall Precaution Comments: post-op shoe to R foot Restrictions Weight Bearing Restrictions: No                          Transfers    Deferred. Pt. Seen at bed level.                  Balance                                           ADL either performed or assessed with clinical judgement   ADL Overall ADL's : Needs assistance/impaired Eating/Feeding: Bed level;Independent   Grooming: Bed level;Independent   Upper Body Bathing: Bed level;Maximal assistance   Lower Body Bathing: Bed level;Total assistance   Upper Body Dressing : Bed level;Maximal assistance   Lower Body Dressing: Bed level;Total assistance   Toilet Transfer: Total assistance Toilet Transfer Details (indicate cue type and reason): bed level Toileting- Clothing Manipulation and Hygiene: Total assistance          General ADL Comments: Pt. and family education was provided about energy conservation, work simplification techniques. A handout was provided.     Vision Baseline Vision/History: Wears glasses Wears Glasses: At all times Patient Visual Report: No change from baseline Vision Assessment?: No apparent visual deficits   Perception     Praxis      Cognition Arousal/Alertness: Awake/alert Behavior During Therapy: WFL for tasks assessed/performed Overall Cognitive Status: Within Functional Limits for tasks assessed                                          Exercises     Shoulder Instructions       General Comments      Pertinent Vitals/ Pain       Pain Assessment: 0-10 Pain Score: 6  Pain Location: right knee Pain Descriptors / Indicators: Aching Pain Intervention(s): Limited activity within patient's tolerance;Monitored during session  Home Living                                          Prior Functioning/Environment  Frequency           Progress Toward Goals  OT Goals(current goals can now be found in the care plan section)  Progress towards OT goals: OT to reassess next treatment  Acute Rehab OT Goals Patient Stated Goal: to get better OT Goal Formulation: With patient Time For Goal Achievement: 11/10/17 Potential to Achieve Goals: Good  Plan Discharge plan remains appropriate    Co-evaluation                 AM-PAC PT "6 Clicks" Daily Activity     Outcome Measure   Help from another person eating meals?: None Help from another person taking care of personal grooming?: None Help from another person toileting, which includes using toliet, bedpan, or urinal?: Total Help from another person bathing (including washing, rinsing, drying)?: Total Help from another person to put on and taking off regular upper body clothing?: Total Help from another person to put on and taking off regular  lower body clothing?: Total 6 Click Score: 12    End of Session    OT Visit Diagnosis: Other abnormalities of gait and mobility (R26.89);Muscle weakness (generalized) (M62.81);History of falling (Z91.81);Pain   Activity Tolerance Patient limited by fatigue   Patient Left in bed;with call bell/phone within reach;with bed alarm set   Nurse Communication          Time: [id number] OT Time Calculation (min): 18 min  Charges: OT General Charges $OT Visit: 1 Visit OT Treatments $Self Care/Home Management : 8-22 mins  Olegario MessierElaine Kahlin Mark, MS, OTR/L  Olegario Messierlaine Dylann Layne 10/28/2017, 11:57 AM

## 2017-10-28 NOTE — Progress Notes (Signed)
[facility] [employer] [facility] has been received. [facility]. Patient is aware of above.   Baker Hughes IncorporatedBailey Lesleyann Fichter, LCSW [phone]

## 2017-10-28 NOTE — Plan of Care (Signed)
  Problem: Health Behavior/Discharge Planning: Goal: Ability to manage health-related needs will improve Outcome: Progressing   Problem: Clinical Measurements: Goal: Ability to maintain clinical measurements within normal limits will improve Outcome: Progressing Goal: Will remain free from infection Outcome: Progressing Goal: Diagnostic test results will improve Outcome: Progressing Goal: Respiratory complications will improve Outcome: Progressing Goal: Cardiovascular complication will be avoided Outcome: Progressing   Problem: Clinical Measurements: Goal: Will remain free from infection Outcome: Progressing   Problem: Activity: Goal: Risk for activity intolerance will decrease Outcome: Progressing   Problem: Nutrition: Goal: Adequate nutrition will be maintained Outcome: Progressing   Problem: Elimination: Goal: Will not experience complications related to bowel motility Outcome: Progressing Goal: Will not experience complications related to urinary retention Outcome: Progressing   Problem: Pain Managment: Goal: General experience of comfort will improve Outcome: Progressing   Problem: Safety: Goal: Ability to remain free from injury will improve Outcome: Progressing   Problem: Skin Integrity: Goal: Risk for impaired skin integrity will decrease Outcome: Progressing   Problem: Skin Integrity: Goal: Risk for impaired skin integrity will decrease Outcome: Progressing

## 2017-10-29 LAB — CBC
HEMATOCRIT: 28.7 % — AB (ref 35.0–47.0)
Hemoglobin: 9.5 g/dL — ABNORMAL LOW (ref 12.0–16.0)
MCH: 28.3 pg (ref 26.0–34.0)
MCHC: 33.1 g/dL (ref 32.0–36.0)
MCV: 85.4 fL (ref 80.0–100.0)
PLATELETS: 213 10*3/uL (ref 150–440)
RBC: 3.36 MIL/uL — ABNORMAL LOW (ref 3.80–5.20)
RDW: 16.4 % — AB (ref 11.5–14.5)
WBC: 10 10*3/uL (ref 3.6–11.0)

## 2017-10-29 LAB — GLUCOSE, CAPILLARY
GLUCOSE-CAPILLARY: 54 mg/dL — AB (ref 70–99)
GLUCOSE-CAPILLARY: 80 mg/dL (ref 70–99)
GLUCOSE-CAPILLARY: 115 mg/dL — AB (ref 70–99)
Glucose-Capillary: 122 mg/dL — ABNORMAL HIGH (ref 70–99)
Glucose-Capillary: 148 mg/dL — ABNORMAL HIGH (ref 70–99)

## 2017-10-29 LAB — BASIC METABOLIC PANEL
ANION GAP: 8 (ref 5–15)
BUN: 38 mg/dL — ABNORMAL HIGH (ref 8–23)
CO2: 26 mmol/L (ref 22–32)
CREATININE: 1.48 mg/dL — AB (ref 0.44–1.00)
Calcium: 8.1 mg/dL — ABNORMAL LOW (ref 8.9–10.3)
Chloride: 103 mmol/L (ref 98–111)
GFR, EST AFRICAN AMERICAN: 39 mL/min — AB (ref >60)
GFR, EST NON AFRICAN AMERICAN: 34 mL/min — AB (ref >60)
Glucose, Bld: 100 mg/dL — ABNORMAL HIGH (ref 70–99)
Potassium: 3.3 mmol/L — ABNORMAL LOW (ref 3.5–5.1)
SODIUM: 137 mmol/L (ref 135–145)

## 2017-10-29 MED ORDER — POLYETHYLENE GLYCOL 3350 17 G PO PACK
17.0000 g | PACK | Freq: Every day | ORAL | Status: DC
  Administered 2017-10-29 – 2017-10-30 (×2): 17 g via ORAL
  Filled 2017-10-29 (×2): qty 1

## 2017-10-29 MED ORDER — SODIUM CHLORIDE 0.9 % IV SOLN
1.0000 g | INTRAVENOUS | Status: DC
  Administered 2017-10-29 – 2017-10-30 (×2): 1 g via INTRAVENOUS
  Filled 2017-10-29: qty 10
  Filled 2017-10-29: qty 1
  Filled 2017-10-29: qty 10

## 2017-10-29 MED ORDER — POTASSIUM CHLORIDE CRYS ER 20 MEQ PO TBCR
40.0000 meq | EXTENDED_RELEASE_TABLET | Freq: Once | ORAL | Status: AC
  Administered 2017-10-29: 40 meq via ORAL
  Filled 2017-10-29: qty 2

## 2017-10-29 NOTE — Progress Notes (Signed)
Notified Dr. Anne HahnWillis this am of 50 ml's of urine output from shift total. UA which resulted from 6pm 7/11 was uti+. Reported seeing a note mentioning not to order antobiotics until cultures come back, but that was prior to UA resulting. Dr. Anne HahnWillis adjusted MIVF to 16100ml/hr, ordered rocephin, and added a urine culture.   Called lab this am to request culture be added to urine specimen that is downstairs. Lab said they would add.

## 2017-10-29 NOTE — Progress Notes (Signed)
[facility] - [place] at [facility]   PATIENT NAME: Priscilla Johnson    MR#:  [id number]  DATE OF BIRTH:  Jun 19, 1945  SUBJECTIVE:  CHIEF COMPLAINT:   Chief Complaint  Patient presents with  . Fall  . Weakness   -complaints about knee pain and low-grade fevers today. -Blood pressures on the low side and the blood sugars as well  REVIEW OF SYSTEMS:  Review of Systems  Constitutional: Positive for fever and malaise/fatigue. Negative for chills.  HENT: Negative for congestion, ear discharge, hearing loss and nosebleeds.   Eyes: Negative for blurred vision and double vision.  Respiratory: Negative for cough, shortness of breath and wheezing.   Cardiovascular: Negative for chest pain and palpitations.  Gastrointestinal: Positive for constipation. Negative for abdominal pain, diarrhea, nausea and vomiting.  Genitourinary: Negative for dysuria.  Musculoskeletal: Positive for joint pain and myalgias.  Neurological: Negative for dizziness, focal weakness, seizures, weakness and headaches.  Psychiatric/Behavioral: Negative for depression.    DRUG ALLERGIES:  No Known Allergies  VITALS:  Blood pressure 109/66, pulse 89, temperature 98.1 F (36.7 C), temperature source Oral, resp. rate 16, height 5\' 2"  (1.575 m), weight 121.1 kg (267 lb), SpO2 99 %.  PHYSICAL EXAMINATION:  Physical Exam  GENERAL:  72 y.o.-year-old obese patient lying in the bed with no acute distress.  EYES: Pupils equal, round, reactive to light and accommodation. No scleral icterus. Extraocular muscles intact.  HEENT: Head atraumatic, normocephalic. Oropharynx and nasopharynx clear.  NECK:  Supple, no jugular venous distention. No thyroid enlargement, no tenderness.  LUNGS: Normal breath sounds bilaterally, no wheezing, rales,rhonchi or crepitation. No use of accessory muscles of respiration.  Decreased bibasilar breath sounds. CARDIOVASCULAR: S1, S2 normal. No  rubs, or gallops.  2/6 systolic  murmur is ABDOMEN: Soft, nontender, nondistended. Bowel sounds present. No organomegaly or mass.  EXTREMITIES: Right foot great toe taped.  Bruising around left foot great toe noted no pedal edema, cyanosis, or clubbing.  NEUROLOGIC: Cranial nerves II through XII are intact. Muscle strength 5/5 in all extremities. Sensation intact. Gait not checked.  Global weakness present PSYCHIATRIC: The patient is alert and oriented x 3.  SKIN: No obvious rash, lesion, or ulcer.    LABORATORY PANEL:   CBC Recent Labs  Lab 10/29/17 0434  WBC 10.0  HGB 9.5*  HCT 28.7*  PLT 213   ------------------------------------------------------------------------------------------------------------------  Chemistries  Recent Labs  Lab 10/29/17 0434  NA 137  K 3.3*  CL 103  CO2 26  GLUCOSE 100*  BUN 38*  CREATININE 1.48*  CALCIUM 8.1*   ------------------------------------------------------------------------------------------------------------------  Cardiac Enzymes Recent Labs  Lab 10/26/17 1831  TROPONINI <0.03   ------------------------------------------------------------------------------------------------------------------  RADIOLOGY:  Dg Chest 2 View  Result Date: 10/28/2017 CLINICAL DATA:  Fever EXAM: CHEST - 2 VIEW COMPARISON:  None. FINDINGS: There is bilateral interstitial thickening and prominence of the central pulmonary vasculature. There is no focal consolidation. There is no pleural effusion or pneumothorax. There is stable cardiomegaly. The osseous structures are unremarkable. IMPRESSION: Cardiomegaly with mild pulmonary vascular congestion. Electronically Signed   By: Elige KoHetal  Patel   On: 10/28/2017 11:36    EKG:   Orders placed or performed during the hospital encounter of 10/26/17  . ED EKG  . ED EKG  . EKG 12-Lead  . EKG 12-Lead    ASSESSMENT AND PLAN:   72 year old female with morbid obesity, sleep apnea, diabetes, hypertension presents to hospital secondary to  recurrent falls.  1.  Recurrent falls-ataxia and unsteady  gait. -CT head negative for any acute neurological reasons.  PT and OT recommended rehab at this time. -Could be from orthostatic hypotension.  Blood pressure medications have been adjusted  2. Acute cystitis-causing  fevers and hypotension. -sepsis after admission.  -Initial UA was negative, repeat UA is positive. Urine cultures pending and started on Rocephin this morning -low blood pressure causing ATN and mild acute renal insufficiency..  3.  Right great toe fracture after mechanical fall-status post splinting and tape.  Flat soled shoes recommended for now  4.  Diabetes- held both glipizide, metformin, Actos as sugars are low - on sliding scale insulin  5.  Hypertension- hold norvasc, atenolol and  Lisinopril as BP low normal  6.  DVT prophylaxis-subcutaneous heparin  Patient has a bed at [facility].  Likely discharge tomorrow    All the records are reviewed and case discussed with Care Management/Social Workerr. Management plans discussed with the patient, family and they are in agreement.  CODE STATUS: Full code  TOTAL TIME TAKING CARE OF THIS PATIENT: 37 minutes.   POSSIBLE D/C TOMORROW, DEPENDING ON CLINICAL CONDITION.   Enid Baas M.D on 10/29/2017 at 9:17 AM  Between 7am to 6pm - Pager - [phone]  After 6pm go to [email or web address] - Social research officer, government  [facility] [place] [facility]  Office  [phone]  CC: Primary care physician; Gabriel Cirri, NP

## 2017-10-29 NOTE — Care Management Important Message (Signed)
Important Message  Patient Details  Name: Priscilla Johnson MRN: [id number] Date of Birth: 08-21-1945   Medicare Important Message Given:  Yes    Olegario MessierKathy A Mckenna Boruff 10/29/2017, 12:30 PM

## 2017-10-29 NOTE — Progress Notes (Signed)
Per MD patient will be stable for D/C tomorrow. Plan is for patient to D/C to [facility]. [facility]inator at [facility] is aware of above.   Baker Hughes IncorporatedBailey Thy Gullikson, LCSW [phone]

## 2017-10-30 DIAGNOSIS — R531 Weakness: Secondary | ICD-10-CM | POA: Diagnosis not present

## 2017-10-30 DIAGNOSIS — E1159 Type 2 diabetes mellitus with other circulatory complications: Secondary | ICD-10-CM | POA: Diagnosis not present

## 2017-10-30 DIAGNOSIS — M150 Primary generalized (osteo)arthritis: Secondary | ICD-10-CM | POA: Diagnosis not present

## 2017-10-30 DIAGNOSIS — W1830XA Fall on same level, unspecified, initial encounter: Secondary | ICD-10-CM | POA: Diagnosis not present

## 2017-10-30 DIAGNOSIS — E1169 Type 2 diabetes mellitus with other specified complication: Secondary | ICD-10-CM | POA: Diagnosis not present

## 2017-10-30 DIAGNOSIS — R296 Repeated falls: Secondary | ICD-10-CM | POA: Diagnosis not present

## 2017-10-30 DIAGNOSIS — F5101 Primary insomnia: Secondary | ICD-10-CM | POA: Diagnosis not present

## 2017-10-30 DIAGNOSIS — Y92009 Unspecified place in unspecified non-institutional (private) residence as the place of occurrence of the external cause: Secondary | ICD-10-CM | POA: Diagnosis not present

## 2017-10-30 DIAGNOSIS — G4733 Obstructive sleep apnea (adult) (pediatric): Secondary | ICD-10-CM | POA: Diagnosis not present

## 2017-10-30 DIAGNOSIS — J189 Pneumonia, unspecified organism: Secondary | ICD-10-CM | POA: Diagnosis not present

## 2017-10-30 DIAGNOSIS — I10 Essential (primary) hypertension: Secondary | ICD-10-CM | POA: Diagnosis not present

## 2017-10-30 DIAGNOSIS — S92414S Nondisplaced fracture of proximal phalanx of right great toe, sequela: Secondary | ICD-10-CM | POA: Diagnosis not present

## 2017-10-30 DIAGNOSIS — N3000 Acute cystitis without hematuria: Secondary | ICD-10-CM | POA: Diagnosis not present

## 2017-10-30 DIAGNOSIS — E7849 Other hyperlipidemia: Secondary | ICD-10-CM | POA: Diagnosis not present

## 2017-10-30 DIAGNOSIS — Z7984 Long term (current) use of oral hypoglycemic drugs: Secondary | ICD-10-CM | POA: Diagnosis not present

## 2017-10-30 DIAGNOSIS — Z23 Encounter for immunization: Secondary | ICD-10-CM | POA: Diagnosis not present

## 2017-10-30 DIAGNOSIS — S92401A Displaced unspecified fracture of right great toe, initial encounter for closed fracture: Secondary | ICD-10-CM | POA: Diagnosis not present

## 2017-10-30 DIAGNOSIS — K5900 Constipation, unspecified: Secondary | ICD-10-CM | POA: Diagnosis not present

## 2017-10-30 DIAGNOSIS — S51011S Laceration without foreign body of right elbow, sequela: Secondary | ICD-10-CM | POA: Diagnosis not present

## 2017-10-30 DIAGNOSIS — N183 Chronic kidney disease, stage 3 (moderate): Secondary | ICD-10-CM | POA: Diagnosis not present

## 2017-10-30 DIAGNOSIS — R2681 Unsteadiness on feet: Secondary | ICD-10-CM | POA: Diagnosis not present

## 2017-10-30 DIAGNOSIS — E119 Type 2 diabetes mellitus without complications: Secondary | ICD-10-CM | POA: Diagnosis not present

## 2017-10-30 DIAGNOSIS — M6281 Muscle weakness (generalized): Secondary | ICD-10-CM | POA: Diagnosis not present

## 2017-10-30 DIAGNOSIS — E1129 Type 2 diabetes mellitus with other diabetic kidney complication: Secondary | ICD-10-CM | POA: Diagnosis not present

## 2017-10-30 DIAGNOSIS — Z7401 Bed confinement status: Secondary | ICD-10-CM | POA: Diagnosis not present

## 2017-10-30 LAB — BASIC METABOLIC PANEL
Anion gap: 8 (ref 5–15)
BUN: 30 mg/dL — ABNORMAL HIGH (ref 8–23)
CO2: 24 mmol/L (ref 22–32)
Calcium: 8 mg/dL — ABNORMAL LOW (ref 8.9–10.3)
Chloride: 105 mmol/L (ref 98–111)
Creatinine, Ser: 1.04 mg/dL — ABNORMAL HIGH (ref 0.44–1.00)
GFR calc Af Amer: >60 mL/min (ref >60)
GFR calc non Af Amer: 52 mL/min — ABNORMAL LOW (ref >60)
Glucose, Bld: 149 mg/dL — ABNORMAL HIGH (ref 70–99)
Potassium: 4.3 mmol/L (ref 3.5–5.1)
Sodium: 137 mmol/L (ref 135–145)

## 2017-10-30 LAB — GLUCOSE, CAPILLARY
Glucose-Capillary: 188 mg/dL — ABNORMAL HIGH (ref 70–99)
Glucose-Capillary: 145 mg/dL — ABNORMAL HIGH (ref 70–99)

## 2017-10-30 MED ORDER — POLYETHYLENE GLYCOL 3350 17 G PO PACK: 17.0000 g | PACK | Freq: Every day | ORAL | 0 refills | Status: AC

## 2017-10-30 MED ORDER — HYDROCODONE-ACETAMINOPHEN 5-325 MG PO TABS: 1.0000 | ORAL_TABLET | Freq: Four times a day (QID) | ORAL | 0 refills | Status: DC | PRN

## 2017-10-30 MED ORDER — TRAZODONE HCL 50 MG PO TABS: 25.0000 mg | ORAL_TABLET | Freq: Every evening | ORAL | 0 refills | Status: DC | PRN

## 2017-10-30 MED ORDER — BISACODYL 10 MG RE SUPP
10.0000 mg | Freq: Once | RECTAL | Status: AC
  Administered 2017-10-30: 10 mg via RECTAL
  Filled 2017-10-30: qty 1

## 2017-10-30 MED ORDER — CIPROFLOXACIN HCL 500 MG PO TABS: 500.0000 mg | ORAL_TABLET | Freq: Two times a day (BID) | ORAL | 0 refills | Status: AC

## 2017-10-30 MED ORDER — CEPHALEXIN 500 MG PO CAPS: 500.0000 mg | ORAL_CAPSULE | Freq: Three times a day (TID) | ORAL | 0 refills | Status: DC

## 2017-10-30 MED ORDER — AMLODIPINE BESYLATE 5 MG PO TABS: 5.0000 mg | ORAL_TABLET | Freq: Every day | ORAL | 2 refills | Status: AC

## 2017-10-30 NOTE — Progress Notes (Signed)
Pt D/C to [facility] by EMS. Report given to RN Britta MccreedyBarbara at [facility]. IV removed intact. VSS. All belongings sent with pt.

## 2017-10-30 NOTE — Discharge Summary (Addendum)
[facility] - [place] at [facility]   PATIENT NAME: Priscilla Johnson    MR#:  [id number]  DATE OF BIRTH:  [birth year]  DATE OF ADMISSION:  10/26/2017   ADMITTING PHYSICIAN: Priscilla CopaAngela Maier, MD  DATE OF DISCHARGE:  10/30/17  PRIMARY CARE PHYSICIAN: Priscilla Johnson, [name], Johnson   ADMISSION DIAGNOSIS:   Left leg weakness [R29.898] Recurrent falls [R29.6] Laceration of right elbow, initial encounter [S51.011A] Nondisplaced fracture of proximal phalanx of right great toe, initial encounter for closed fracture [S92.414A]  DISCHARGE DIAGNOSIS:   Active Problems:   Toe fracture   SECONDARY DIAGNOSIS:   Past Medical History:  Diagnosis Date  . Diabetes mellitus without complication (HCC)   . Gout   . Hyperlipidemia   . Hypertension   . Insomnia   . Shoulder pain, right   . Sleep apnea     HOSPITAL COURSE:   72 year old female with morbid obesity, sleep apnea, diabetes, hypertension presents to hospital secondary to recurrent falls.  1.  Recurrent falls-ataxia and unsteady gait. -CT head negative for any acute neurological reasons.  PT and OT recommended rehab at this time. -Could be from orthostatic hypotension.  Blood pressure medications have been adjusted  2. Acute cystitis-causing  fevers and hypotension. -sepsis after admission.  -Initial UA was negative, repeat UA is positive. Urine cultures growing enterobacter aerogens -Received reception in the hospital. Will be discharged on cipro for a week -low blood pressure causing ATN and mild acute renal insufficiency-improved by discharge..  3.  Right great toe fracture after mechanical fall-status post splinting and tape.  Flat soled shoes recommended for now  4.  Diabetes-with low blood sugars while in the hospital. Actors and glipizide are being discontinued at discharge. Can we start low-dose metformin. Recommend monitoring blood sugars at least twice a day.   5.  Hypertension-low-dose Norvasc only at  discharge. Lisinopril and atenelol discontinued and discharge   PT recommended rehab- [facility].    DISCHARGE CONDITIONS:   Guarded  CONSULTS OBTAINED:   None  DRUG ALLERGIES:   No Known Allergies DISCHARGE MEDICATIONS:   Allergies as of 10/30/2017   No Known Allergies     Medication List    STOP taking these medications   atenolol 100 MG tablet Commonly known as:  TENORMIN   BD PEN NEEDLE NANO U/F 32G X 4 MM Misc Generic drug:  Insulin Pen Needle   glipiZIDE 5 MG tablet Commonly known as:  GLUCOTROL   lisinopril-hydrochlorothiazide 20-25 MG tablet Commonly known as:  PRINZIDE,ZESTORETIC   pioglitazone 30 MG tablet Commonly known as:  ACTOS     TAKE these medications   amLODipine 5 MG tablet Commonly known as:  NORVASC Take 1 tablet (5 mg total) by mouth daily. What changed:    medication strength  how much to take   ciprofloxacin 500 MG tablet Commonly known as:  CIPRO Take 1 tablet (500 mg total) by mouth 2 (two) times daily for 7 days.   fluocinonide ointment 0.05 % Commonly known as:  LIDEX Apply 1 application topically as needed (dermatitis).   HYDROcodone-acetaminophen 5-325 MG tablet Commonly known as:  NORCO/VICODIN Take 1 tablet by mouth every 6 (six) hours as needed for moderate pain or severe pain.   metFORMIN 500 MG tablet Commonly known as:  GLUCOPHAGE TAKE 1 TABLET BY MOUTH TWO  TIMES DAILY   polyethylene glycol packet Commonly known as:  MIRALAX / GLYCOLAX Take 17 g by mouth daily. Start taking on:  10/31/2017   traZODone 50 MG tablet Commonly known as:  DESYREL Take 0.5 tablets (25 mg total) by mouth at bedtime as needed for sleep.        DISCHARGE INSTRUCTIONS:   1. PCP f/u in 1 week  DIET:   Cardiac diet  ACTIVITY:   Activity as tolerated  OXYGEN:   Home Oxygen: No.  Oxygen Delivery: room air  DISCHARGE LOCATION:   nursing home   If you experience worsening of your  admission symptoms, develop shortness of breath, life threatening emergency, suicidal or homicidal thoughts you must seek medical attention immediately by calling 911 or calling your MD immediately  if symptoms less severe.  You Must read complete instructions/literature along with all the possible adverse reactions/side effects for all the Medicines you take and that have been prescribed to you. Take any new Medicines after you have completely understood and accpet all the possible adverse reactions/side effects.   Please note  You were cared for by a hospitalist during your hospital stay. If you have any questions about your discharge medications or the care you received while you were in the hospital after you are discharged, you can call the unit and asked to speak with the hospitalist on call if the hospitalist that took care of you is not available. Once you are discharged, your primary care physician will handle any further medical issues. Please note that NO REFILLS for any discharge medications will be authorized once you are discharged, as it is imperative that you return to your primary care physician (or establish a relationship with a primary care physician if you do not have one) for your aftercare needs so that they can reassess your need for medications and monitor your lab values.    On the day of Discharge:  VITAL SIGNS:   Blood pressure (!) 156/42, pulse 81, temperature 99.4 F (37.4 C), temperature source Oral, resp. rate 18, height 5\' 2"  (1.575 m), weight 122.5 kg (270 lb), SpO2 100 %.  PHYSICAL EXAMINATION:   GENERAL:  72 y.o.-year-old obese patient lying in the bed with no acute distress.  EYES: Pupils equal, round, reactive to light and accommodation. No scleral icterus. Extraocular muscles intact.  HEENT: Head atraumatic, normocephalic. Oropharynx and nasopharynx clear.  NECK:  Supple, no jugular venous distention. No thyroid enlargement, no tenderness.  LUNGS: Normal  breath sounds bilaterally, no wheezing, rales,rhonchi or crepitation. No use of accessory muscles of respiration.  Decreased bibasilar breath sounds. CARDIOVASCULAR: S1, S2 normal. No  rubs, or gallops.  2/6 systolic murmur is ABDOMEN: Soft, nontender, nondistended. Bowel sounds present. No organomegaly or mass.  EXTREMITIES: Right foot great toe taped.  Bruising around left foot great toe noted no pedal edema, cyanosis, or clubbing.  NEUROLOGIC: Cranial nerves II through XII are intact. Muscle strength 5/5 in all extremities. Sensation intact. Gait not checked.  Global weakness present PSYCHIATRIC: The patient is alert and oriented x 3.  SKIN: No obvious rash, lesion, or ulcer.     DATA REVIEW:   CBC Recent Labs  Lab 10/29/17 0434  WBC 10.0  HGB 9.5*  HCT 28.7*  PLT 213    Chemistries  Recent Labs  Lab 10/30/17 0714  NA 137  K 4.3  CL 105  CO2 24  GLUCOSE 149*  BUN 30*  CREATININE 1.04*  CALCIUM 8.0*     Microbiology Results  Results for orders placed or performed during the hospital encounter of 10/26/17  Urine culture     Status:  None   Collection Time: 10/26/17  6:31 PM  Result Value Ref Range Status   Specimen Description   Final    URINE, RANDOM Performed at [facility], [address], [place], [place] [address]    Special Requests   Final    NONE Performed at [facility], [address], [place], [place] [address]    Culture   Final    NO GROWTH Performed at [facility], [address] [place], [place] [address]    Report Status 10/28/2017 FINAL  Final  CULTURE, BLOOD (ROUTINE X 2) w Reflex to ID Panel     Status: None (Preliminary result)   Collection Time: 10/28/17  8:25 AM  Result Value Ref Range Status   Specimen Description BLOOD LEFT ASSIST CONTROL  Final   Special Requests   Final    BOTTLES DRAWN AEROBIC AND ANAEROBIC Blood Culture adequate volume   Culture   Final    NO GROWTH 2 DAYS Performed at [facility], [address] [place], [place] [address]    Report Status PENDING  Incomplete  CULTURE, BLOOD (ROUTINE X 2) w Reflex to ID Panel     Status: None (Preliminary result)   Collection Time: 10/28/17  8:25 AM  Result Value Ref Range Status   Specimen Description BLOOD RIGHT ASSIST CONTROL  Final   Special Requests   Final    BOTTLES DRAWN AEROBIC AND ANAEROBIC Blood Culture adequate volume   Culture   Final    NO GROWTH 2 DAYS Performed at [facility], [address] [place], [place] [address]    Report Status PENDING  Incomplete  Urine Culture     Status: Abnormal (Preliminary result)   Collection Time: 10/28/17  6:32 PM  Result Value Ref Range Status   Specimen Description   Final    URINE, RANDOM Performed at [facility], [address], [place], [place] [address]    Special Requests   Final    NONE Performed at [facility], [address], [place], [place] [address]    Culture >=100,000 COLONIES/mL ENTEROBACTER AEROGENES (A)  Final   Report Status PENDING  Incomplete    RADIOLOGY:  No results found.   Management plans discussed with the patient, family and they are in agreement.  CODE STATUS:     Code Status Orders  (From admission, onward)        Start     Ordered   10/26/17 2351  Full code  Continuous     10/26/17 2350    Code Status History    This patient has a current code status but no historical code status.      TOTAL TIME TAKING CARE OF THIS PATIENT: 38 minutes.    Enid Baas M.D on 10/30/2017 at 11:52 AM  Between 7am to 6pm - Pager - [phone]  After 6pm go to [email or web address] - Social research officer, government  [facility] [place] [facility]  Office  [phone]  CC: Primary care physician; Priscilla Cirri, Johnson   Note: This dictation was prepared with Dragon dictation along with smaller phrase technology. Any transcriptional errors that result from this process are unintentional.

## 2017-10-30 NOTE — Clinical Social Work Note (Signed)
The patient will discharge today via non-emergent EMS to [facility]. The patient, her spouse, and the facility are aware and in agreement. The CSW has sent all discharge information to the facility and has delivered the discharge packet to the chart. The CSW is signing off. Please consult should additional needs arise.  Argentina PonderKaren Martha Rhyse Loux, MSW, Theresia MajorsLCSWA [phone]

## 2017-10-31 LAB — URINE CULTURE: Culture: >=100000 — AB

## 2017-11-01 DIAGNOSIS — R531 Weakness: Secondary | ICD-10-CM | POA: Diagnosis not present

## 2017-11-01 DIAGNOSIS — E1169 Type 2 diabetes mellitus with other specified complication: Secondary | ICD-10-CM | POA: Diagnosis not present

## 2017-11-01 DIAGNOSIS — I10 Essential (primary) hypertension: Secondary | ICD-10-CM | POA: Diagnosis not present

## 2017-11-01 DIAGNOSIS — E1159 Type 2 diabetes mellitus with other circulatory complications: Secondary | ICD-10-CM | POA: Diagnosis not present

## 2017-11-02 ENCOUNTER — Ambulatory Visit: Payer: Medicare Other | Admitting: Unknown Physician Specialty

## 2017-11-02 LAB — CULTURE, BLOOD (ROUTINE X 2)
CULTURE: NO GROWTH
Culture: NO GROWTH
SPECIAL REQUESTS: ADEQUATE
SPECIAL REQUESTS: ADEQUATE

## 2017-11-22 DIAGNOSIS — J189 Pneumonia, unspecified organism: Secondary | ICD-10-CM | POA: Diagnosis not present

## 2017-11-22 NOTE — Progress Notes (Signed)
Needs Diabetic Maintence Needs BP >140/90

## 2017-12-14 DIAGNOSIS — M109 Gout, unspecified: Secondary | ICD-10-CM | POA: Diagnosis not present

## 2017-12-14 DIAGNOSIS — N183 Chronic kidney disease, stage 3 (moderate): Secondary | ICD-10-CM | POA: Diagnosis not present

## 2017-12-21 DIAGNOSIS — N183 Chronic kidney disease, stage 3 (moderate): Secondary | ICD-10-CM | POA: Diagnosis not present

## 2017-12-21 DIAGNOSIS — G4730 Sleep apnea, unspecified: Secondary | ICD-10-CM | POA: Diagnosis not present

## 2017-12-21 DIAGNOSIS — M109 Gout, unspecified: Secondary | ICD-10-CM | POA: Diagnosis not present

## 2017-12-21 DIAGNOSIS — R531 Weakness: Secondary | ICD-10-CM | POA: Diagnosis not present

## 2018-01-17 DIAGNOSIS — R319 Hematuria, unspecified: Secondary | ICD-10-CM | POA: Diagnosis not present

## 2018-01-17 DIAGNOSIS — N390 Urinary tract infection, site not specified: Secondary | ICD-10-CM | POA: Diagnosis not present

## 2018-02-10 ENCOUNTER — Other Ambulatory Visit: Payer: Self-pay

## 2018-02-10 NOTE — Patient Outreach (Signed)
[facility]) Care Management  02/10/2018  Priscilla Johnson Oct 30, 1945 [id number]   Medication Adherence call to Priscilla Johnson left a message for patient to call back patient is due on Atorvastatin 40 mg. [name] is showing past due under [place] [employer] care Ins.   Lillia Abed CPhT Pharmacy Technician [facility] Care Management Direct Dial [phone]  Fax [phone] [email or web address]

## 2018-02-15 ENCOUNTER — Telehealth: Payer: Self-pay | Admitting: Unknown Physician Specialty

## 2018-02-15 NOTE — Telephone Encounter (Signed)
Husband called to advise the pt now lives at [facility] and her health care is handled through them. He states pt will not be coming home.

## 2018-03-24 DIAGNOSIS — Z79899 Other long term (current) drug therapy: Secondary | ICD-10-CM | POA: Diagnosis not present

## 2018-03-24 DIAGNOSIS — E7849 Other hyperlipidemia: Secondary | ICD-10-CM | POA: Diagnosis not present

## 2018-04-26 DIAGNOSIS — E1159 Type 2 diabetes mellitus with other circulatory complications: Secondary | ICD-10-CM | POA: Diagnosis not present

## 2018-04-26 DIAGNOSIS — E1169 Type 2 diabetes mellitus with other specified complication: Secondary | ICD-10-CM | POA: Diagnosis not present

## 2018-04-26 DIAGNOSIS — E785 Hyperlipidemia, unspecified: Secondary | ICD-10-CM | POA: Diagnosis not present

## 2018-04-26 DIAGNOSIS — I10 Essential (primary) hypertension: Secondary | ICD-10-CM | POA: Diagnosis not present

## 2018-05-30 DIAGNOSIS — Z79899 Other long term (current) drug therapy: Secondary | ICD-10-CM | POA: Diagnosis not present

## 2018-05-30 DIAGNOSIS — N390 Urinary tract infection, site not specified: Secondary | ICD-10-CM | POA: Diagnosis not present

## 2018-05-30 DIAGNOSIS — R319 Hematuria, unspecified: Secondary | ICD-10-CM | POA: Diagnosis not present

## 2018-06-14 ENCOUNTER — Telehealth: Payer: Self-pay | Admitting: Unknown Physician Specialty

## 2018-06-14 DIAGNOSIS — E119 Type 2 diabetes mellitus without complications: Secondary | ICD-10-CM | POA: Diagnosis not present

## 2018-06-14 DIAGNOSIS — E7849 Other hyperlipidemia: Secondary | ICD-10-CM | POA: Diagnosis not present

## 2018-06-14 DIAGNOSIS — N183 Chronic kidney disease, stage 3 (moderate): Secondary | ICD-10-CM | POA: Diagnosis not present

## 2018-06-14 DIAGNOSIS — R319 Hematuria, unspecified: Secondary | ICD-10-CM | POA: Diagnosis not present

## 2018-06-14 NOTE — Telephone Encounter (Unsigned)
Copied from CRM [id number]. Topic: Medicare AWV >> Jun 14, 2018 12:51 PM Earlyne Iba wrote: Called to schedule Medicare Annual Wellness Visit with the Nurse Health Advisor. No answer, left message to call Misty Stanley at [phone].  If patient returns call, please note: their last AWV was on 07/20/16 please schedule AWV-s  with [facility] any date AFTER 07/20/2017  Thank you! For any questions please contact: Trixie Rude at [phone] or Skype [email or web address]

## 2018-07-08 DIAGNOSIS — R300 Dysuria: Secondary | ICD-10-CM | POA: Diagnosis not present

## 2018-07-11 DIAGNOSIS — Z79899 Other long term (current) drug therapy: Secondary | ICD-10-CM | POA: Diagnosis not present

## 2018-07-11 DIAGNOSIS — R319 Hematuria, unspecified: Secondary | ICD-10-CM | POA: Diagnosis not present

## 2018-07-11 DIAGNOSIS — N390 Urinary tract infection, site not specified: Secondary | ICD-10-CM | POA: Diagnosis not present

## 2018-07-22 DIAGNOSIS — E1169 Type 2 diabetes mellitus with other specified complication: Secondary | ICD-10-CM | POA: Diagnosis not present

## 2018-07-22 DIAGNOSIS — E785 Hyperlipidemia, unspecified: Secondary | ICD-10-CM | POA: Diagnosis not present

## 2018-07-22 DIAGNOSIS — E1159 Type 2 diabetes mellitus with other circulatory complications: Secondary | ICD-10-CM | POA: Diagnosis not present

## 2018-07-22 DIAGNOSIS — I10 Essential (primary) hypertension: Secondary | ICD-10-CM | POA: Diagnosis not present

## 2018-07-23 DIAGNOSIS — I10 Essential (primary) hypertension: Secondary | ICD-10-CM | POA: Diagnosis not present

## 2018-07-23 DIAGNOSIS — E785 Hyperlipidemia, unspecified: Secondary | ICD-10-CM | POA: Diagnosis not present

## 2018-07-23 DIAGNOSIS — E1129 Type 2 diabetes mellitus with other diabetic kidney complication: Secondary | ICD-10-CM | POA: Diagnosis not present

## 2018-07-23 DIAGNOSIS — E7849 Other hyperlipidemia: Secondary | ICD-10-CM | POA: Diagnosis not present

## 2018-07-27 ENCOUNTER — Other Ambulatory Visit: Payer: Self-pay

## 2018-07-27 DIAGNOSIS — Z79899 Other long term (current) drug therapy: Secondary | ICD-10-CM | POA: Diagnosis not present

## 2018-07-27 DIAGNOSIS — R319 Hematuria, unspecified: Secondary | ICD-10-CM | POA: Diagnosis not present

## 2018-07-27 DIAGNOSIS — N390 Urinary tract infection, site not specified: Secondary | ICD-10-CM | POA: Diagnosis not present

## 2018-07-27 NOTE — Patient Outreach (Signed)
[facility]) Care Management  07/27/2018  Priscilla Johnson 06-Mar-1946 [id number]   Medication Adherence call to Mrs. Priscilla Johnson HIPPA Compliant Voice message left with a call back number. Mrs, [name] is showing past due on Metformin 500 mg under [employer] Ins.   Lillia Abed CPhT Pharmacy Technician [facility] Care Management Direct Dial [phone]  Fax [phone] [email or web address]

## 2018-07-28 DIAGNOSIS — Z0000 Encounter for general adult medical examination without abnormal findings: Secondary | ICD-10-CM | POA: Diagnosis not present

## 2018-07-28 DIAGNOSIS — Z139 Encounter for screening, unspecified: Secondary | ICD-10-CM | POA: Diagnosis not present

## 2018-08-15 DIAGNOSIS — R29898 Other symptoms and signs involving the musculoskeletal system: Secondary | ICD-10-CM | POA: Diagnosis not present

## 2018-08-15 DIAGNOSIS — R296 Repeated falls: Secondary | ICD-10-CM | POA: Diagnosis not present

## 2018-08-17 ENCOUNTER — Other Ambulatory Visit: Payer: Self-pay | Admitting: Neurology

## 2018-08-17 DIAGNOSIS — R29898 Other symptoms and signs involving the musculoskeletal system: Secondary | ICD-10-CM

## 2018-08-22 DIAGNOSIS — E119 Type 2 diabetes mellitus without complications: Secondary | ICD-10-CM | POA: Diagnosis not present

## 2018-08-22 DIAGNOSIS — M6281 Muscle weakness (generalized): Secondary | ICD-10-CM | POA: Diagnosis not present

## 2018-09-19 ENCOUNTER — Ambulatory Visit: Payer: Medicare Other

## 2018-09-22 DIAGNOSIS — M6281 Muscle weakness (generalized): Secondary | ICD-10-CM | POA: Diagnosis not present

## 2018-09-22 DIAGNOSIS — E119 Type 2 diabetes mellitus without complications: Secondary | ICD-10-CM | POA: Diagnosis not present

## 2018-10-24 DIAGNOSIS — E119 Type 2 diabetes mellitus without complications: Secondary | ICD-10-CM | POA: Diagnosis not present

## 2018-10-24 DIAGNOSIS — E785 Hyperlipidemia, unspecified: Secondary | ICD-10-CM | POA: Diagnosis not present

## 2018-10-24 DIAGNOSIS — E1169 Type 2 diabetes mellitus with other specified complication: Secondary | ICD-10-CM | POA: Diagnosis not present

## 2018-11-11 DIAGNOSIS — E1140 Type 2 diabetes mellitus with diabetic neuropathy, unspecified: Secondary | ICD-10-CM | POA: Diagnosis not present

## 2018-11-11 DIAGNOSIS — B351 Tinea unguium: Secondary | ICD-10-CM | POA: Diagnosis not present

## 2018-11-11 DIAGNOSIS — S90222A Contusion of left lesser toe(s) with damage to nail, initial encounter: Secondary | ICD-10-CM | POA: Diagnosis not present

## 2018-11-18 DIAGNOSIS — R0981 Nasal congestion: Secondary | ICD-10-CM | POA: Diagnosis not present

## 2018-11-24 DIAGNOSIS — E119 Type 2 diabetes mellitus without complications: Secondary | ICD-10-CM | POA: Diagnosis not present

## 2018-11-24 DIAGNOSIS — R0981 Nasal congestion: Secondary | ICD-10-CM | POA: Diagnosis not present

## 2018-12-28 DIAGNOSIS — E119 Type 2 diabetes mellitus without complications: Secondary | ICD-10-CM | POA: Diagnosis not present

## 2019-01-04 DIAGNOSIS — R0981 Nasal congestion: Secondary | ICD-10-CM | POA: Diagnosis not present

## 2019-01-05 ENCOUNTER — Telehealth: Payer: Self-pay | Admitting: Unknown Physician Specialty

## 2019-01-05 DIAGNOSIS — R05 Cough: Secondary | ICD-10-CM | POA: Diagnosis not present

## 2019-01-05 DIAGNOSIS — N390 Urinary tract infection, site not specified: Secondary | ICD-10-CM | POA: Diagnosis not present

## 2019-01-05 DIAGNOSIS — R319 Hematuria, unspecified: Secondary | ICD-10-CM | POA: Diagnosis not present

## 2019-01-05 NOTE — Chronic Care Management (AMB) (Signed)
°  Chronic Care Management   Outreach Note  01/05/2019 Name: Priscilla Johnson MRN: [id number] DOB: 05-14-1945  Referred by: Kathrine Haddock, NP Reason for referral : Chronic Care Management (Initial CCM outreach was unsuccessful. )   An unsuccessful telephone outreach was attempted today. The patient was referred to the case management team by for assistance with chronic care management and care coordination.   Follow Up Plan: A HIPPA compliant phone message was left for the patient providing contact information and requesting a return call.  The care management team will reach out to the patient again over the next 7 days.  If patient returns call to provider office, please advise to call [place] at [place]  [email or web address]   ??[phone]

## 2019-01-11 NOTE — Chronic Care Management (AMB) (Signed)
°  Chronic Care Management   Outreach Note  01/11/2019 Name: Priscilla Johnson MRN: [id number] DOB: [birth year]  Referred by: Kathrine Haddock, NP Reason for referral : Chronic Care Management (Initial CCM outreach was unsuccessful. ) and Chronic Care Management (Second CCM outreach was unsuccessful. )   A second unsuccessful telephone outreach was attempted today. The patient was referred to the case management team for assistance with chronic care management and care coordination.   Follow Up Plan: A HIPPA compliant phone message was left for the patient providing contact information and requesting a return call.  The care management team will reach out to the patient again over the next 7 days.  If patient returns call to provider office, please advise to call [place] at [place]  [email or web address]   ??[phone]

## 2019-01-12 ENCOUNTER — Inpatient Hospital Stay (HOSPITAL_COMMUNITY)
Admission: AD | Admit: 2019-01-12 | Discharge: 2019-01-17 | DRG: 177 | Disposition: A | Discharge status: Skilled Nursing Facility | Payer: Medicare Other | Source: Other Acute Inpatient Hospital | Attending: Internal Medicine | Admitting: Internal Medicine

## 2019-01-12 ENCOUNTER — Encounter: Payer: Self-pay | Admitting: Emergency Medicine

## 2019-01-12 ENCOUNTER — Emergency Department
Admission: EM | Admit: 2019-01-12 | Discharge: 2019-01-12 | Disposition: A | Discharge status: Other Acute Inpatient Hospital | Payer: Medicare Other | Attending: Emergency Medicine | Admitting: Emergency Medicine

## 2019-01-12 ENCOUNTER — Emergency Department: Payer: Medicare Other

## 2019-01-12 ENCOUNTER — Other Ambulatory Visit: Payer: Self-pay

## 2019-01-12 ENCOUNTER — Encounter (HOSPITAL_COMMUNITY): Payer: Self-pay

## 2019-01-12 DIAGNOSIS — J9611 Chronic respiratory failure with hypoxia: Secondary | ICD-10-CM

## 2019-01-12 DIAGNOSIS — I10 Essential (primary) hypertension: Secondary | ICD-10-CM | POA: Diagnosis present

## 2019-01-12 DIAGNOSIS — Z23 Encounter for immunization: Secondary | ICD-10-CM | POA: Diagnosis not present

## 2019-01-12 DIAGNOSIS — R579 Shock, unspecified: Secondary | ICD-10-CM | POA: Diagnosis not present

## 2019-01-12 DIAGNOSIS — U071 COVID-19: Secondary | ICD-10-CM | POA: Diagnosis not present

## 2019-01-12 DIAGNOSIS — J181 Lobar pneumonia, unspecified organism: Secondary | ICD-10-CM | POA: Diagnosis not present

## 2019-01-12 DIAGNOSIS — E785 Hyperlipidemia, unspecified: Secondary | ICD-10-CM | POA: Diagnosis present

## 2019-01-12 DIAGNOSIS — R1310 Dysphagia, unspecified: Secondary | ICD-10-CM | POA: Diagnosis present

## 2019-01-12 DIAGNOSIS — J1289 Other viral pneumonia: Secondary | ICD-10-CM | POA: Diagnosis not present

## 2019-01-12 DIAGNOSIS — R0602 Shortness of breath: Secondary | ICD-10-CM | POA: Diagnosis not present

## 2019-01-12 DIAGNOSIS — R0902 Hypoxemia: Secondary | ICD-10-CM

## 2019-01-12 DIAGNOSIS — Z7401 Bed confinement status: Secondary | ICD-10-CM | POA: Diagnosis not present

## 2019-01-12 DIAGNOSIS — M2550 Pain in unspecified joint: Secondary | ICD-10-CM | POA: Diagnosis not present

## 2019-01-12 DIAGNOSIS — E1122 Type 2 diabetes mellitus with diabetic chronic kidney disease: Secondary | ICD-10-CM | POA: Insufficient documentation

## 2019-01-12 DIAGNOSIS — N183 Chronic kidney disease, stage 3 unspecified: Secondary | ICD-10-CM | POA: Diagnosis present

## 2019-01-12 DIAGNOSIS — J9621 Acute and chronic respiratory failure with hypoxia: Secondary | ICD-10-CM | POA: Diagnosis not present

## 2019-01-12 DIAGNOSIS — Z823 Family history of stroke: Secondary | ICD-10-CM | POA: Diagnosis not present

## 2019-01-12 DIAGNOSIS — F419 Anxiety disorder, unspecified: Secondary | ICD-10-CM | POA: Diagnosis present

## 2019-01-12 DIAGNOSIS — R402411 Glasgow coma scale score 13-15, in the field [EMT or ambulance]: Secondary | ICD-10-CM | POA: Diagnosis not present

## 2019-01-12 DIAGNOSIS — Z7984 Long term (current) use of oral hypoglycemic drugs: Secondary | ICD-10-CM | POA: Diagnosis not present

## 2019-01-12 DIAGNOSIS — Z79899 Other long term (current) drug therapy: Secondary | ICD-10-CM | POA: Insufficient documentation

## 2019-01-12 DIAGNOSIS — R1084 Generalized abdominal pain: Secondary | ICD-10-CM | POA: Diagnosis not present

## 2019-01-12 DIAGNOSIS — R069 Unspecified abnormalities of breathing: Secondary | ICD-10-CM | POA: Diagnosis not present

## 2019-01-12 DIAGNOSIS — I129 Hypertensive chronic kidney disease with stage 1 through stage 4 chronic kidney disease, or unspecified chronic kidney disease: Secondary | ICD-10-CM | POA: Insufficient documentation

## 2019-01-12 DIAGNOSIS — Z90710 Acquired absence of both cervix and uterus: Secondary | ICD-10-CM | POA: Diagnosis not present

## 2019-01-12 DIAGNOSIS — J189 Pneumonia, unspecified organism: Secondary | ICD-10-CM | POA: Diagnosis not present

## 2019-01-12 DIAGNOSIS — J1282 Pneumonia due to coronavirus disease 2019: Secondary | ICD-10-CM

## 2019-01-12 DIAGNOSIS — Z833 Family history of diabetes mellitus: Secondary | ICD-10-CM

## 2019-01-12 DIAGNOSIS — R7989 Other specified abnormal findings of blood chemistry: Secondary | ICD-10-CM | POA: Diagnosis not present

## 2019-01-12 DIAGNOSIS — E1165 Type 2 diabetes mellitus with hyperglycemia: Secondary | ICD-10-CM | POA: Diagnosis not present

## 2019-01-12 DIAGNOSIS — R0600 Dyspnea, unspecified: Secondary | ICD-10-CM | POA: Diagnosis not present

## 2019-01-12 LAB — CBC WITH DIFFERENTIAL/PLATELET
Abs Immature Granulocytes: 0.41 10*3/uL — ABNORMAL HIGH (ref 0.00–0.07)
Basophils Absolute: 0.1 10*3/uL (ref 0.0–0.1)
Basophils Relative: 1 %
Eosinophils Absolute: 0 10*3/uL (ref 0.0–0.5)
Eosinophils Relative: 0 %
HCT: 41.9 % (ref 36.0–46.0)
Hemoglobin: 13.2 g/dL (ref 12.0–15.0)
Immature Granulocytes: 3 %
Lymphocytes Relative: 5 %
Lymphs Abs: 0.7 10*3/uL (ref 0.7–4.0)
MCH: 29.1 pg (ref 26.0–34.0)
MCHC: 31.5 g/dL (ref 30.0–36.0)
MCV: 92.5 fL (ref 80.0–100.0)
Monocytes Absolute: 0.6 10*3/uL (ref 0.1–1.0)
Monocytes Relative: 5 %
Neutro Abs: 11.8 10*3/uL — ABNORMAL HIGH (ref 1.7–7.7)
Neutrophils Relative %: 86 %
Platelets: 437 10*3/uL — ABNORMAL HIGH (ref 150–400)
RBC: 4.53 MIL/uL (ref 3.87–5.11)
RDW: 13.2 % (ref 11.5–15.5)
WBC: 13.7 10*3/uL — ABNORMAL HIGH (ref 4.0–10.5)
nRBC: 0 % (ref 0.0–0.2)

## 2019-01-12 LAB — COMPREHENSIVE METABOLIC PANEL
ALT: 9 U/L (ref 0–44)
AST: 20 U/L (ref 15–41)
Albumin: 2.6 g/dL — ABNORMAL LOW (ref 3.5–5.0)
Alkaline Phosphatase: 56 U/L (ref 38–126)
Anion gap: 12 (ref 5–15)
BUN: 18 mg/dL (ref 8–23)
CO2: 28 mmol/L (ref 22–32)
Calcium: 8.1 mg/dL — ABNORMAL LOW (ref 8.9–10.3)
Chloride: 97 mmol/L — ABNORMAL LOW (ref 98–111)
Creatinine, Ser: 0.39 mg/dL — ABNORMAL LOW (ref 0.44–1.00)
GFR calc Af Amer: >60 mL/min (ref >60)
GFR calc non Af Amer: >60 mL/min (ref >60)
Glucose, Bld: 187 mg/dL — ABNORMAL HIGH (ref 70–99)
Potassium: 4.2 mmol/L (ref 3.5–5.1)
Sodium: 137 mmol/L (ref 135–145)
Total Bilirubin: 0.4 mg/dL (ref 0.3–1.2)
Total Protein: 5.7 g/dL — ABNORMAL LOW (ref 6.5–8.1)

## 2019-01-12 LAB — BASIC METABOLIC PANEL
Anion gap: 16 — ABNORMAL HIGH (ref 5–15)
BUN: 16 mg/dL (ref 8–23)
CO2: 30 mmol/L (ref 22–32)
Calcium: 8.8 mg/dL — ABNORMAL LOW (ref 8.9–10.3)
Chloride: 86 mmol/L — ABNORMAL LOW (ref 98–111)
Creatinine, Ser: 0.45 mg/dL (ref 0.44–1.00)
GFR calc Af Amer: >60 mL/min (ref >60)
GFR calc non Af Amer: >60 mL/min (ref >60)
Glucose, Bld: 210 mg/dL — ABNORMAL HIGH (ref 70–99)
Potassium: 4.2 mmol/L (ref 3.5–5.1)
Sodium: 132 mmol/L — ABNORMAL LOW (ref 135–145)

## 2019-01-12 LAB — D-DIMER, QUANTITATIVE: D-Dimer, Quant: 0.52 ug/mL-FEU — ABNORMAL HIGH (ref 0.00–0.50)

## 2019-01-12 LAB — PROCALCITONIN: Procalcitonin: 0.15 ng/mL

## 2019-01-12 LAB — GLUCOSE, CAPILLARY: Glucose-Capillary: 177 mg/dL — ABNORMAL HIGH (ref 70–99)

## 2019-01-12 LAB — LACTIC ACID, PLASMA
Lactic Acid, Venous: 2.7 mmol/L (ref 0.5–1.9)
Lactic Acid, Venous: 1.2 mmol/L (ref 0.5–1.9)

## 2019-01-12 LAB — TROPONIN I (HIGH SENSITIVITY): Troponin I (High Sensitivity): 830 ng/L (ref <18)

## 2019-01-12 LAB — SARS CORONAVIRUS 2 BY RT PCR (HOSPITAL ORDER, PERFORMED IN ~~LOC~~ HOSPITAL LAB): SARS Coronavirus 2: POSITIVE — AB

## 2019-01-12 LAB — C-REACTIVE PROTEIN: CRP: 5.9 mg/dL — ABNORMAL HIGH (ref <1.0)

## 2019-01-12 MED ORDER — ONDANSETRON HCL 4 MG PO TABS: 4.0000 mg | ORAL_TABLET | Freq: Four times a day (QID) | ORAL | Status: DC | PRN

## 2019-01-12 MED ORDER — ZINC SULFATE 220 (50 ZN) MG PO CAPS
220.0000 mg | ORAL_CAPSULE | Freq: Every day | ORAL | Status: DC
  Administered 2019-01-12 – 2019-01-17 (×6): 220 mg via ORAL
  Filled 2019-01-12 (×6): qty 1

## 2019-01-12 MED ORDER — VITAMIN C 500 MG PO TABS
500.0000 mg | ORAL_TABLET | Freq: Every day | ORAL | Status: DC
  Administered 2019-01-12 – 2019-01-17 (×6): 500 mg via ORAL
  Filled 2019-01-12 (×6): qty 1

## 2019-01-12 MED ORDER — ENOXAPARIN SODIUM 40 MG/0.4ML ~~LOC~~ SOLN
40.0000 mg | SUBCUTANEOUS | Status: DC
  Administered 2019-01-12: 21:00:00 40 mg via SUBCUTANEOUS
  Filled 2019-01-12: qty 0.4

## 2019-01-12 MED ORDER — SODIUM CHLORIDE 0.9 % IV BOLUS
1000.0000 mL | Freq: Once | INTRAVENOUS | Status: AC
  Administered 2019-01-12: 1000 mL via INTRAVENOUS

## 2019-01-12 MED ORDER — SODIUM CHLORIDE 0.9 % IV SOLN
200.0000 mg | Freq: Once | INTRAVENOUS | Status: AC
  Administered 2019-01-12: 200 mg via INTRAVENOUS
  Filled 2019-01-12: qty 40

## 2019-01-12 MED ORDER — ENOXAPARIN SODIUM 80 MG/0.8ML ~~LOC~~ SOLN: 1.0000 mg/kg | SUBCUTANEOUS | Status: DC

## 2019-01-12 MED ORDER — INSULIN ASPART 100 UNIT/ML ~~LOC~~ SOLN
0.0000 [IU] | Freq: Three times a day (TID) | SUBCUTANEOUS | Status: DC
  Administered 2019-01-13: 3 [IU] via SUBCUTANEOUS
  Administered 2019-01-13 (×2): 2 [IU] via SUBCUTANEOUS
  Administered 2019-01-14 (×2): 3 [IU] via SUBCUTANEOUS
  Administered 2019-01-14: 2 [IU] via SUBCUTANEOUS
  Administered 2019-01-15: 17:00:00 5 [IU] via SUBCUTANEOUS
  Administered 2019-01-15: 08:00:00 3 [IU] via SUBCUTANEOUS
  Administered 2019-01-15: 13:00:00 5 [IU] via SUBCUTANEOUS
  Administered 2019-01-16 – 2019-01-17 (×5): 3 [IU] via SUBCUTANEOUS

## 2019-01-12 MED ORDER — SODIUM CHLORIDE 0.9% FLUSH
3.0000 mL | Freq: Two times a day (BID) | INTRAVENOUS | Status: DC
  Administered 2019-01-13 – 2019-01-17 (×9): 3 mL via INTRAVENOUS

## 2019-01-12 MED ORDER — ONDANSETRON HCL 4 MG/2ML IJ SOLN: 4.0000 mg | Freq: Four times a day (QID) | INTRAMUSCULAR | Status: DC | PRN

## 2019-01-12 MED ORDER — DEXAMETHASONE SODIUM PHOSPHATE 10 MG/ML IJ SOLN
6.0000 mg | INTRAMUSCULAR | Status: DC
  Administered 2019-01-12 – 2019-01-16 (×5): 6 mg via INTRAVENOUS
  Filled 2019-01-12 (×5): qty 1

## 2019-01-12 MED ORDER — DEXAMETHASONE SODIUM PHOSPHATE 10 MG/ML IJ SOLN
10.0000 mg | Freq: Once | INTRAMUSCULAR | Status: AC
  Administered 2019-01-12: 10 mg via INTRAVENOUS
  Filled 2019-01-12: qty 1

## 2019-01-12 MED ORDER — ASPIRIN EC 81 MG PO TBEC
81.0000 mg | DELAYED_RELEASE_TABLET | Freq: Every day | ORAL | Status: DC
  Administered 2019-01-13 – 2019-01-17 (×5): 81 mg via ORAL
  Filled 2019-01-12 (×5): qty 1

## 2019-01-12 MED ORDER — SODIUM CHLORIDE 0.9 % IV SOLN
100.0000 mg | INTRAVENOUS | Status: AC
  Administered 2019-01-13 – 2019-01-16 (×4): 100 mg via INTRAVENOUS
  Filled 2019-01-12 (×4): qty 20

## 2019-01-12 MED ORDER — SODIUM CHLORIDE 0.9% FLUSH: 3.0000 mL | INTRAVENOUS | Status: DC | PRN

## 2019-01-12 MED ORDER — SODIUM CHLORIDE 0.9 % IV SOLN: 250.0000 mL | INTRAVENOUS | Status: DC | PRN

## 2019-01-12 MED ORDER — LORAZEPAM 2 MG/ML IJ SOLN
0.5000 mg | Freq: Once | INTRAMUSCULAR | Status: AC
  Administered 2019-01-12: 0.5 mg via INTRAVENOUS
  Filled 2019-01-12: qty 1

## 2019-01-12 MED ORDER — ACETAMINOPHEN 325 MG PO TABS
650.0000 mg | ORAL_TABLET | Freq: Four times a day (QID) | ORAL | Status: DC | PRN
  Administered 2019-01-13 – 2019-01-14 (×2): 650 mg via ORAL
  Filled 2019-01-12 (×2): qty 2

## 2019-01-12 MED ORDER — INSULIN ASPART 100 UNIT/ML ~~LOC~~ SOLN
0.0000 [IU] | Freq: Every day | SUBCUTANEOUS | Status: DC
  Administered 2019-01-13 – 2019-01-14 (×2): 3 [IU] via SUBCUTANEOUS

## 2019-01-12 NOTE — ED Notes (Signed)
Date and time results received: 01/12/19  (11:15 AM)  Test: COIVD-19 Critical Value: POSITIVE   Name of Provider Notified: [facility] RN   Orders Received? Or Actions Taken?: .Acknowledged, no new orders at this time

## 2019-01-12 NOTE — ED Notes (Signed)
Pt checked and dry at this time. 

## 2019-01-12 NOTE — H&P (Signed)
History and Physical    Priscilla Johnson ZOX:[id number] DOB: 1945-10-16 DOA: 01/12/2019  PCP: [name], [name], NP  Patient coming from: home  Chief Complaint:  sob  HPI: Priscilla Johnson is a 73 y.o. female with medical history significant of dm, htn comes in with one week of worsening sob and cough.  Chronically on 3 liters at home of oxygen.  No fevers.  No n/v/ some diarrhea.  No abd pain or chest pain.  Referred for admit for covid pna.  Has been on po abx and steroids for last 4 days and not better.  Stable on her 3 liters [address].     Review of Systems: As per HPI otherwise 10 point review of systems negative.   Past Medical History:  Diagnosis Date  . Diabetes mellitus without complication (HCC)   . Gout   . Hyperlipidemia   . Hypertension   . Insomnia   . Shoulder pain, right   . Sleep apnea     Past Surgical History:  Procedure Laterality Date  . ABDOMINAL HYSTERECTOMY       reports that she has never smoked. She has never used smokeless tobacco. She reports current alcohol use. She reports that she does not use drugs.  No Known Allergies  Family History  Problem Relation Age of Onset  . Stroke Mother   . Diabetes Brother   . Diabetes Maternal Grandfather   . Diabetes Brother   . Sleep apnea Brother     Prior to Admission medications   Medication Sig Start Date End Date Taking? Authorizing Provider  amLODipine (NORVASC) 5 MG tablet Take 1 tablet (5 mg total) by mouth daily. 10/30/17   Enid BaasKalisetti, Radhika, MD  atenolol (TENORMIN) 25 MG tablet Take 25 mg by mouth daily. 01/07/19   [provider]  Cranberry 450 MG CAPS Take 900 mg by mouth 2 (two) times daily.    [provider]  febuxostat (ULORIC) 40 MG tablet Take 40 mg by mouth daily. 01/09/19   [provider]  fluocinonide ointment (LIDEX) 0.05 % Apply 1 application topically as needed (dermatitis).  04/17/15   [provider]  levofloxacin (LEVAQUIN) 750 MG tablet Take 750  mg by mouth daily. For 7 days 01/06/19   [provider]  lisinopril (ZESTRIL) 10 MG tablet Take 10 mg by mouth daily. 12/24/18   [provider]  metFORMIN (GLUCOPHAGE) 500 MG tablet TAKE 1 TABLET BY MOUTH TWO  TIMES DAILY 10/19/17   [name], [name], NP  polyethylene glycol (MIRALAX / GLYCOLAX) packet Take 17 g by mouth daily. 10/31/17   Enid BaasKalisetti, Radhika, MD  traZODone (DESYREL) 50 MG tablet Take 0.5 tablets (25 mg total) by mouth at bedtime as needed for sleep. Patient not taking: Reported on 01/12/2019 10/30/17   Enid BaasKalisetti, Radhika, MD    Physical Exam: Vitals:   01/12/19 1943  BP: 97/61  Pulse: 97  Resp: (!) 24  Temp: 97.9 F (36.6 C)  TempSrc: Oral  SpO2: 100%  Weight: 68.4 kg  Height: 5\' 2"  (1.575 m)      Constitutional: NAD, calm, comfortable Vitals:   01/12/19 1943  BP: 97/61  Pulse: 97  Resp: (!) 24  Temp: 97.9 F (36.6 C)  TempSrc: Oral  SpO2: 100%  Weight: 68.4 kg  Height: 5\' 2"  (1.575 m)   Eyes: PERRL, lids and conjunctivae normal ENMT: Mucous membranes are moist. Posterior pharynx clear of any exudate or lesions.Normal dentition.  Neck: normal, supple, no masses, no thyromegaly Respiratory: clear to  auscultation bilaterally, no wheezing, no crackles. Normal respiratory effort. No accessory muscle use.  Cardiovascular: Regular rate and rhythm, no murmurs / rubs / gallops. No extremity edema. 2+ pedal pulses. No carotid bruits.  Abdomen: no tenderness, no masses palpated. No hepatosplenomegaly. Bowel sounds positive.  Musculoskeletal: no clubbing / cyanosis. No joint deformity upper and lower extremities. Good ROM, no contractures. Normal muscle tone.  Skin: no rashes, lesions, ulcers. No induration Neurologic: CN 2-12 grossly intact. Sensation intact, DTR normal. Strength 5/5 in all 4.  Psychiatric: Normal judgment and insight. Alert and oriented x 3. Normal mood.    Labs on Admission: I have personally reviewed following labs and imaging  studies  CBC: Recent Labs  Lab 01/12/19 0953  WBC 13.7*  NEUTROABS 11.8*  HGB 13.2  HCT 41.9  MCV 92.5  PLT 437*   Basic Metabolic Panel: Recent Labs  Lab 01/12/19 0953  NA 132*  K 4.2  CL 86*  CO2 30  GLUCOSE 210*  BUN 16  CREATININE 0.45  CALCIUM 8.8*   GFR: Estimated Creatinine Clearance: 55.9 mL/min (by C-G formula based on SCr of 0.45 mg/dL). Liver Function Tests: No results for input(s): AST, ALT, ALKPHOS, BILITOT, PROT, ALBUMIN in the last 168 hours. No results for input(s): LIPASE, AMYLASE in the last 168 hours. No results for input(s): AMMONIA in the last 168 hours. Coagulation Profile: No results for input(s): INR, PROTIME in the last 168 hours. Cardiac Enzymes: No results for input(s): CKTOTAL, CKMB, CKMBINDEX, TROPONINI in the last 168 hours. BNP (last 3 results) No results for input(s): PROBNP in the last 8760 hours. HbA1C: No results for input(s): HGBA1C in the last 72 hours. CBG: No results for input(s): GLUCAP in the last 168 hours. Lipid Profile: No results for input(s): CHOL, HDL, LDLCALC, TRIG, CHOLHDL, LDLDIRECT in the last 72 hours. Thyroid Function Tests: No results for input(s): TSH, T4TOTAL, FREET4, T3FREE, THYROIDAB in the last 72 hours. Anemia Panel: No results for input(s): VITAMINB12, FOLATE, FERRITIN, TIBC, IRON, RETICCTPCT in the last 72 hours. Urine analysis:    Component Value Date/Time   COLORURINE AMBER (A) 10/28/2017 1832   APPEARANCEUR CLOUDY (A) 10/28/2017 1832   LABSPEC 1.029 10/28/2017 1832   PHURINE 5.0 10/28/2017 1832   GLUCOSEU NEGATIVE 10/28/2017 1832   HGBUR SMALL (A) 10/28/2017 1832   BILIRUBINUR NEGATIVE 10/28/2017 1832   KETONESUR NEGATIVE 10/28/2017 1832   PROTEINUR 30 (A) 10/28/2017 1832   NITRITE POSITIVE (A) 10/28/2017 1832   LEUKOCYTESUR MODERATE (A) 10/28/2017 1832   Sepsis Labs: !!!!!!!!!!!!!!!!!!!!!!!!!!!!!!!!!!!!!!!!!!!! @LABRCNTIP (procalcitonin:4,lacticidven:4) ) Recent Results (from the past  240 hour(s))  SARS Coronavirus 2 [facility] order, Performed in [facility] hospital lab) Nasopharyngeal Nasopharyngeal Swab     Status: Abnormal   Collection Time: 01/12/19  9:53 AM   Specimen: Nasopharyngeal Swab  Result Value Ref Range Status   SARS Coronavirus 2 POSITIVE (A) NEGATIVE Final    Comment: RESULT CALLED TO, READ BACK BY AND VERIFIED WITH: SARAH MCCLENDON AT 1114 ON 01/12/2019 [facility]. (NOTE) If result is NEGATIVE SARS-CoV-2 target nucleic acids are NOT DETECTED. The SARS-CoV-2 RNA is generally detectable in upper and lower  respiratory specimens during the acute phase of infection. The lowest  concentration of SARS-CoV-2 viral copies this assay can detect is 250  copies / mL. A negative result does not preclude SARS-CoV-2 infection  and should not be used as the sole basis for treatment or other  patient management decisions.  A negative result may occur with  improper specimen collection /  handling, submission of specimen other  than nasopharyngeal swab, presence of viral mutation(s) within the  areas targeted by this assay, and inadequate number of viral copies  (<250 copies / mL). A negative result must be combined with clinical  observations, patient history, and epidemiological information. If result is POSITIVE SARS-CoV-2 target nucleic acids are DETECT ED. The SARS-CoV-2 RNA is generally detectable in upper and lower  respiratory specimens during the acute phase of infection.  Positive  results are indicative of active infection with SARS-CoV-2.  Clinical  correlation with patient history and other diagnostic information is  necessary to determine patient infection status.  Positive results do  not rule out bacterial infection or co-infection with other viruses. If result is PRESUMPTIVE POSTIVE SARS-CoV-2 nucleic acids MAY BE PRESENT.   A presumptive positive result was obtained on the submitted specimen  and confirmed on repeat testing.  While 2019 novel  coronavirus  (SARS-CoV-2) nucleic acids may be present in the submitted sample  additional confirmatory testing may be necessary for epidemiological  and / or clinical management purposes  to differentiate between  SARS-CoV-2 and other Sarbecovirus currently known to infect humans.  If clinically indicated additional testing with an alternate test  methodology (LAB74 53) is advised. The SARS-CoV-2 RNA is generally  detectable in upper and lower respiratory specimens during the acute  phase of infection. The expected result is Negative. Fact Sheet for Patients:  StrictlyIdeas.no Fact Sheet for Healthcare Providers: BankingDealers.co.za This test is not yet approved or cleared by the [place] FDA and has been authorized for detection and/or diagnosis of SARS-CoV-2 by FDA under an Emergency Use Authorization (EUA).  This EUA will remain in effect (meaning this test can be used) for the duration of the COVID-19 declaration under Section 564(b)(1) of the Act, 21 U.S.C. section 360bbb-3(b)(1), unless the authorization is terminated or revoked sooner. Performed at [facility], [address] [place], [place] [address]      Radiological Exams on Admission: Dg Chest Portable 1 View  Result Date: 01/12/2019 CLINICAL DATA:  Shortness of breath EXAM: PORTABLE CHEST 1 VIEW COMPARISON:  10/28/2017 FINDINGS: The heart size and mediastinal contours are stable. Medial right lung base opacity. No pleural effusion. No pneumothorax. The visualized skeletal structures are unremarkable. IMPRESSION: Medial right lung base opacity may reflect atelectasis versus pneumonia. Electronically Signed   By: Davina Poke M.D.   On: 01/12/2019 10:18   Old chart reviewed cxr reviewed rll pna  Assessment/Plan 73 yo female with covid pna  Principal Problem:   Pneumonia due to COVID-19 virus- iv decadron.  Vit c/zinc.  Lovenox.  remdisivir ordered.   Monitor resp status closely.  Active Problems:   Severe obesity ([place])- noted   CKD (chronic kidney disease), stage III ([place])- stable at baseline   Hyperlipidemia- stable   Essential hypertension- stable   Chronic respiratory failure with hypoxia ([place])- on 3 liters, stable   DM- ssi     DVT prophylaxis:  lovenox Code Status:  full Family Communication:  none Disposition Plan:  days Consults called:  none Admission status:  admission   Tabetha Haraway A MD [facility]  If 7PM-7AM, please contact night-coverage [email or web address] Password [facility]  01/12/2019, 8:35 PM

## 2019-01-12 NOTE — ED Notes (Signed)
Pt given medication at this time, pt requesting temp to be turned down. Blankets removed. Wet cold cloth placed on pt's forehead. Pt instructed to take slow deep breaths. MD aware

## 2019-01-12 NOTE — ED Provider Notes (Signed)
[facility] Emergency Department Provider Note  ____________________________________________   I have reviewed the triage vital signs and the nursing notes.   HISTORY  Chief Complaint Cough and Shortness of Breath   History limited by: Not Limited   HPI Priscilla Johnson is a 73 y.o. female who presents to the emergency department today because of concern for continued shortness of breath. Patient is coming from [place]. Has been having shortness of breath with a non productive cough for the past week. States she was diagnosed with pneumonia via x-ray roughly 4 days ago and was put on antibiotics without any relief. Tested negative for covid roughly 1 week ago. Denies any chronic lung or heart disease. Has had some associated diarrhea.    Records reviewed. Per medical record review patient has a history of DM, HLD, HTN.  Past Medical History:  Diagnosis Date  . Diabetes mellitus without complication (HCC)   . Gout   . Hyperlipidemia   . Hypertension   . Insomnia   . Shoulder pain, right   . Sleep apnea     Patient Active Problem List   Diagnosis Date Noted  . Toe fracture 10/26/2017  . Anemia 07/21/2016  . Advanced care planning/counseling discussion 07/20/2016  . Gout 07/20/2016  . Essential hypertension 08/23/2015  . Severe obesity (BMI 35.0-35.9 with comorbidity) (HCC) 04/18/2015  . Insomnia 01/16/2015  . Sleep apnea 01/16/2015  . Type II diabetes mellitus with renal manifestations (HCC) 01/16/2015  . Hypertensive CKD (chronic kidney disease) 01/16/2015  . Severe obesity (HCC) 01/16/2015  . CKD (chronic kidney disease), stage III (HCC) 01/16/2015  . Hyperlipidemia 01/16/2015    Past Surgical History:  Procedure Laterality Date  . ABDOMINAL HYSTERECTOMY      Prior to Admission medications   Medication Sig Start Date End Date Taking? Authorizing Provider  amLODipine (NORVASC) 5 MG tablet Take 1 tablet (5 mg total) by mouth  daily. 10/30/17   Enid Baas, MD  fluocinonide ointment (LIDEX) 0.05 % Apply 1 application topically as needed (dermatitis).  04/17/15   [provider]  HYDROcodone-acetaminophen (NORCO/VICODIN) 5-325 MG tablet Take 1 tablet by mouth every 6 (six) hours as needed for moderate pain or severe pain. 10/30/17   Enid Baas, MD  metFORMIN (GLUCOPHAGE) 500 MG tablet TAKE 1 TABLET BY MOUTH TWO  TIMES DAILY 10/19/17   Gabriel Cirri, NP  polyethylene glycol (MIRALAX / GLYCOLAX) packet Take 17 g by mouth daily. 10/31/17   Enid Baas, MD  traZODone (DESYREL) 50 MG tablet Take 0.5 tablets (25 mg total) by mouth at bedtime as needed for sleep. 10/30/17   Enid Baas, MD    Allergies Patient has no known allergies.  Family History  Problem Relation Age of Onset  . Stroke Mother   . Diabetes Brother   . Diabetes Maternal Grandfather   . Diabetes Brother   . Sleep apnea Brother     Social History Social History   Tobacco Use  . Smoking status: Never Smoker  . Smokeless tobacco: Never Used  Substance Use Topics  . Alcohol use: Yes    Comment: on occasion  . Drug use: No    Review of Systems Constitutional: No fever/chills Eyes: No visual changes. ENT: No sore throat. Cardiovascular: Denies chest pain. Respiratory: Positive for cough and shortness of breath. Gastrointestinal: No abdominal pain.  Positive for diarrhea.  Genitourinary: Negative for dysuria. Musculoskeletal: Negative for back pain. Skin: Negative for rash. Neurological: Negative for headaches, focal weakness or numbness.  ____________________________________________   PHYSICAL EXAM:  VITAL SIGNS: ED Triage Vitals  Enc Vitals Group     BP 01/12/19 0923 103/74     Pulse Rate 01/12/19 0923 (!) 148     Resp 01/12/19 0923 (!) 31     Temp 01/12/19 0923 98.2 F (36.8 C)     Temp Source 01/12/19 0923 Oral     SpO2 01/12/19 0920 98 %     Weight 01/12/19 0924 170 lb (77.1 kg)     Height  01/12/19 0924 5\' 6"  (1.676 m)   Constitutional: Alert and oriented.  Eyes: Conjunctivae are normal.  ENT      Head: Normocephalic and atraumatic.      Nose: No congestion/rhinnorhea.      Mouth/Throat: Mucous membranes are moist.      Neck: No stridor. Hematological/Lymphatic/Immunilogical: No cervical lymphadenopathy. Cardiovascular: Tachycardic. No murmurs appreciated.  Respiratory: Increased respiratory effort. Some rhonchi appreciated.  Gastrointestinal: Soft and non tender. No rebound. No guarding.  Genitourinary: Deferred Musculoskeletal: Normal range of motion in all extremities. No lower extremity edema. Neurologic:  Normal speech and language. No gross focal neurologic deficits are appreciated.  Skin:  Skin is warm, dry and intact. No rash noted. Psychiatric: Mood and affect are normal. Speech and behavior are normal. Patient exhibits appropriate insight and judgment.  ____________________________________________    LABS (pertinent positives/negatives)  COVID positive Lactic 2.7 BMP na 132, k 4.2, glu 210, cr 0.45 CBC wbc 13.7, hgb 13.2, plt 437  ____________________________________________   EKG  I, Nance Pear, attending physician, personally viewed and interpreted this EKG  EKG Time: 0923 Rate: 148 Rhythm: narrow complex tachycardia Axis: normal Intervals: qtc 444 QRS: narrow, q waves v1, v2 ST changes: no st elevation Impression: abnormal ekg   ____________________________________________    RADIOLOGY  CXR Medial right lung base opacity  ____________________________________________   PROCEDURES  Procedures  ____________________________________________   INITIAL IMPRESSION / ASSESSMENT AND PLAN / ED COURSE  Pertinent labs & imaging results that were available during my care of the patient were reviewed by me and considered in my medical decision making (see chart for details).   Patient presented to the emergency department today  from living facility because of concerns for shortness of breath and attending pneumonia.  The patient states that she has been short of breath for the past week.  Patient's x-ray does show an opacity that is likely pneumonia versus atelectasis.  Given the patient coming from living facility COVID test was sent and turned positive.  I discussed this finding with the patient.  Discussed with patient transfer to [facility].  Patient's heart rate did improve after IV fluids.   ____________________________________________   FINAL CLINICAL IMPRESSION(S) / ED DIAGNOSES  Final diagnoses:  Shortness of breath  COVID-19     Note: This dictation was prepared with Dragon dictation. Any transcriptional errors that result from this process are unintentional     Nance Pear, MD 01/12/19 1425

## 2019-01-12 NOTE — ED Notes (Signed)
Pt cleaned up and repositioned in bed at this time. Pt resting comfortably. Pt given meal tray and drink, OK per MD

## 2019-01-12 NOTE — ED Notes (Addendum)
This RN called [place] to update on plan of care.  Will continue to monitor.  This RN spoke to [place], [employer]

## 2019-01-12 NOTE — Progress Notes (Signed)
Contacted [name] MD about patient being a diabetic and takes Metformin at home. Patient's blood sugar this evening was 177. This nurse asked if [name] MD wanted to start patient on a sliding scale. Will continue to monitor.

## 2019-01-12 NOTE — ED Notes (Addendum)
Pt verbalized transfer instructions and has no questions at this time. Pt agreeing to Transfer, paper copy signed

## 2019-01-12 NOTE — Progress Notes (Signed)
MEDICATION RELATED CONSULT NOTE - INITIAL   Pharmacy Consult for Remdesivir Indication: COVID-19  Assessment: 73 yo F presents with SOB and cough. On chronic 3L of O2 at home. CXR shows R lung base opacity. ALT wnl.   Plan:  Give remdesivir 200mg  IV x 1, then start remdesivir 100mg  IV x 4 days Monitor clinical progress and ALT  Elenor Quinones, PharmD, BCPS, BCIDP Clinical Pharmacist 01/12/2019 10:37 PM

## 2019-01-12 NOTE — ED Notes (Signed)
Pt cleaned and repositioned in the bed at this time

## 2019-01-12 NOTE — Progress Notes (Addendum)
On call provider text r/t critical troponin level of 830 received. On call provider returns phone call. No new orders received at this time

## 2019-01-12 NOTE — ED Triage Notes (Signed)
Pt presents to ED via [facility] from [facility] [facility] with c/o [facility] that is becoming increasingly worse x 1 week. Per EMS pt also with weak non-productive cough at this time, pt on baseline 3L O2, EMS reports albuterol tx given by staff at approx 0700 without relief. EMS reports pt dx with pneumonia 4 days ago and is being treated with steroids and abx at this time.

## 2019-01-12 NOTE — ED Notes (Signed)
Date and time results received: 01/12/19 (10:34 AM)  Test: Lactic  Critical Value: 2.7  Name of Provider Notified: Dr. Archie Balboa   Orders Received? Or Actions Taken?: . No new orders at this time

## 2019-01-13 LAB — CBC WITH DIFFERENTIAL/PLATELET
Abs Immature Granulocytes: 0.19 10*3/uL — ABNORMAL HIGH (ref 0.00–0.07)
Basophils Absolute: 0 10*3/uL (ref 0.0–0.1)
Basophils Relative: 0 %
Eosinophils Absolute: 0 10*3/uL (ref 0.0–0.5)
Eosinophils Relative: 0 %
HCT: 37.6 % (ref 36.0–46.0)
Hemoglobin: 11.5 g/dL — ABNORMAL LOW (ref 12.0–15.0)
Immature Granulocytes: 3 %
Lymphocytes Relative: 9 %
Lymphs Abs: 0.5 10*3/uL — ABNORMAL LOW (ref 0.7–4.0)
MCH: 29.3 pg (ref 26.0–34.0)
MCHC: 30.6 g/dL (ref 30.0–36.0)
MCV: 95.7 fL (ref 80.0–100.0)
Monocytes Absolute: 0.1 10*3/uL (ref 0.1–1.0)
Monocytes Relative: 1 %
Neutro Abs: 4.7 10*3/uL (ref 1.7–7.7)
Neutrophils Relative %: 87 %
Platelets: 334 10*3/uL (ref 150–400)
RBC: 3.93 MIL/uL (ref 3.87–5.11)
RDW: 13.5 % (ref 11.5–15.5)
WBC: 5.5 10*3/uL (ref 4.0–10.5)
nRBC: 0 % (ref 0.0–0.2)

## 2019-01-13 LAB — COMPREHENSIVE METABOLIC PANEL
ALT: 8 U/L (ref 0–44)
AST: 17 U/L (ref 15–41)
Albumin: 2.5 g/dL — ABNORMAL LOW (ref 3.5–5.0)
Alkaline Phosphatase: 55 U/L (ref 38–126)
Anion gap: 9 (ref 5–15)
BUN: 16 mg/dL (ref 8–23)
CO2: 31 mmol/L (ref 22–32)
Calcium: 8.3 mg/dL — ABNORMAL LOW (ref 8.9–10.3)
Chloride: 96 mmol/L — ABNORMAL LOW (ref 98–111)
Creatinine, Ser: 0.35 mg/dL — ABNORMAL LOW (ref 0.44–1.00)
GFR calc Af Amer: >60 mL/min (ref >60)
GFR calc non Af Amer: >60 mL/min (ref >60)
Glucose, Bld: 237 mg/dL — ABNORMAL HIGH (ref 70–99)
Potassium: 3.5 mmol/L (ref 3.5–5.1)
Sodium: 136 mmol/L (ref 135–145)
Total Bilirubin: 0.2 mg/dL — ABNORMAL LOW (ref 0.3–1.2)
Total Protein: 5.6 g/dL — ABNORMAL LOW (ref 6.5–8.1)

## 2019-01-13 LAB — TROPONIN I (HIGH SENSITIVITY): Troponin I (High Sensitivity): 901 ng/L (ref <18)

## 2019-01-13 LAB — HEMOGLOBIN A1C
Hgb A1c MFr Bld: 5.5 % (ref 4.8–5.6)
Mean Plasma Glucose: 111.15 mg/dL

## 2019-01-13 LAB — GLUCOSE, CAPILLARY
Glucose-Capillary: 160 mg/dL — ABNORMAL HIGH (ref 70–99)
Glucose-Capillary: 160 mg/dL — ABNORMAL HIGH (ref 70–99)
Glucose-Capillary: 202 mg/dL — ABNORMAL HIGH (ref 70–99)
Glucose-Capillary: 255 mg/dL — ABNORMAL HIGH (ref 70–99)

## 2019-01-13 MED ORDER — ENOXAPARIN SODIUM 40 MG/0.4ML ~~LOC~~ SOLN
40.0000 mg | SUBCUTANEOUS | Status: DC
  Administered 2019-01-13 – 2019-01-16 (×4): 40 mg via SUBCUTANEOUS
  Filled 2019-01-13 (×4): qty 0.4

## 2019-01-13 MED ORDER — INFLUENZA VAC A&B SA ADJ QUAD 0.5 ML IM PRSY
0.5000 mL | PREFILLED_SYRINGE | INTRAMUSCULAR | Status: AC
  Administered 2019-01-16: 0.5 mL via INTRAMUSCULAR
  Filled 2019-01-13: qty 0.5

## 2019-01-13 NOTE — Progress Notes (Signed)
Results for Priscilla Johnson, Priscilla Johnson (MRN [id number]) as of 01/13/2019 00:21  Ref. Range 01/12/2019 22:50  Troponin I (High Sensitivity) Latest Ref Range: <18 ng/L 901 (HH)   Contacted Derrill Kay MD about critical troponin. No orders received. Will continue to monitor.

## 2019-01-13 NOTE — Plan of Care (Signed)
Spoke with husband and updated on current POC and current status.

## 2019-01-13 NOTE — Plan of Care (Signed)
Contacted [facility] who agreed to fax current med list to [phone] ([place]).

## 2019-01-13 NOTE — Progress Notes (Signed)
PROGRESS NOTE    Adrieana Fennelly  ZOX:[id number] DOB: 21-Jun-1945 DOA: 01/12/2019 PCP: Gabriel Cirri, NP   Brief Narrative:  Priscilla Johnson is a 73 y.o. female coming from SNF via [facility], with medical history significant of DM2, HTN and chronic hypoxia (unclear etiology) on 3LNC around the clock, who presents with one week of worsening sob and cough.  No fevers.  No n/v/ but does report some diarrhea.  No abd pain or chest pain.  Referred for admit for covid pna.  Has been on po abx and steroids for last 4 days and not better. Requiring >3L [place] with exertion per ED/facility sign out.     Assessment & Plan:   Principal Problem:   Pneumonia due to COVID-19 virus Active Problems:   Severe obesity (HCC)   CKD (chronic kidney disease), stage III (HCC)   Hyperlipidemia   Essential hypertension   Chronic respiratory failure with hypoxia (HCC)   Pneumonia due to 2019-nCoV   Acute on chronic hypoxic respiratory failure in the setting of COVID-19 pneumonia, POA -Signout patient was hypoxic on baseline 3 L nasal cannula, now appears to be more stable on home regimen of 3 L nasal cannula -Continue Remdesivir, stop date 01/16/2019, Decadron per protocol -Continue supportive care, pronating as tolerated, patient poorly ambulatory, exertional hypoxia will be difficult to obtain  Recent Labs    01/12/19 2050  DDIMER 0.52*  CRP 5.9*   Elevated troponin, likely supply demand mismatch  -She denies chest pain, likely in the setting of above, continue to follow clinically -EKG unremarkable  Reported history of CKD 3 -Creatinine currently within normal limits, follow with morning labs  Hypertension, essential -Well-controlled, resume home medications  Hyperlipidemia -Continue home medications  Severe obesity -Lengthy discussion at bedside about dietary and lifestyle changes   DVT prophylaxis: Lovenox Code Status: Full Family Communication: None available Disposition Plan: Inpatient  -patient continues to require IV steroids, Remdesivir and close monitoring given acute COVID-19 infection as above   Subjective: No acute issues or events overnight, patient remains markedly weak, short of breath even at rest.  Denies chest pain, nausea, vomiting, diarrhea, constipation, headache, fevers, chills.  Objective: Vitals:   01/12/19 1943 01/12/19 2109 01/13/19 0353 01/13/19 0800  BP: 97/61 112/70 106/70   Pulse: 97 99 93   Resp: (!) 24 (!) 23 20   Temp: 97.9 F (36.6 C)  98.3 F (36.8 C) 98.8 F (37.1 C)  TempSrc: Oral  Oral Oral  SpO2: 100% 100% 100%   Weight: 68.4 kg     Height:  (1.575 m)       Intake/Output Summary (Last 24 hours) at 01/13/2019 0818 Last data filed at 01/12/2019 2330 Gross per 24 hour  Intake 250 ml  Output -  Net 250 ml   Filed Weights   01/12/19 1943  Weight: 68.4 kg    Examination:  General:  Pleasantly resting in bed, No acute distress. HEENT:  Normocephalic atraumatic.  Sclerae nonicteric, noninjected.  Extraocular movements intact bilaterally. Neck:  Without mass or deformity.  Trachea is midline. Lungs:  Clear to auscultate bilaterally without rhonchi, wheeze, or rales. Heart:  Regular rate and rhythm.  Without murmurs, rubs, or gallops. Abdomen:  Soft, nontender, nondistended.  Without guarding or rebound. Extremities: Without cyanosis, clubbing, edema, or obvious deformity. Vascular:  Dorsalis pedis and posterior tibial pulses palpable bilaterally. Skin:  Warm and dry, no erythema, no ulcerations.   Data Reviewed: I have personally reviewed following labs and imaging studies  CBC:  Recent Labs  Lab 01/12/19 0953 01/13/19 0320  WBC 13.7* 5.5  NEUTROABS 11.8* 4.7  HGB 13.2 11.5*  HCT 41.9 37.6  MCV 92.5 95.7  PLT 437* 334   Basic Metabolic Panel: Recent Labs  Lab 01/12/19 0953 01/12/19 2050 01/13/19 0320  NA 132* 137 136  K 4.2 4.2 3.5  CL 86* 97* 96*  CO2 30 28 31   GLUCOSE 210* 187* 237*  BUN 16 18 16    CREATININE 0.45 0.39* 0.35*  CALCIUM 8.8* 8.1* 8.3*   GFR: Estimated Creatinine Clearance: 55.9 mL/min (A) (by C-G formula based on SCr of 0.35 mg/dL (L)). Liver Function Tests: Recent Labs  Lab 01/12/19 2050 01/13/19 0320  AST 20 17  ALT 9 8  ALKPHOS 56 55  BILITOT 0.4 0.2*  PROT 5.7* 5.6*  ALBUMIN 2.6* 2.5*   No results for input(s): LIPASE, AMYLASE in the last 168 hours. No results for input(s): AMMONIA in the last 168 hours. Coagulation Profile: No results for input(s): INR, PROTIME in the last 168 hours. Cardiac Enzymes: No results for input(s): CKTOTAL, CKMB, CKMBINDEX, TROPONINI in the last 168 hours. BNP (last 3 results) No results for input(s): PROBNP in the last 8760 hours. HbA1C: Recent Labs    01/12/19 2250  HGBA1C 5.5   CBG: Recent Labs  Lab 01/12/19 2123  GLUCAP 177*   Lipid Profile: No results for input(s): CHOL, HDL, LDLCALC, TRIG, CHOLHDL, LDLDIRECT in the last 72 hours. Thyroid Function Tests: No results for input(s): TSH, T4TOTAL, FREET4, T3FREE, THYROIDAB in the last 72 hours. Anemia Panel: No results for input(s): VITAMINB12, FOLATE, FERRITIN, TIBC, IRON, RETICCTPCT in the last 72 hours. Sepsis Labs: Recent Labs  Lab 01/12/19 0953 01/12/19 2050  PROCALCITON  --  0.15  LATICACIDVEN 2.7* 1.2    Recent Results (from the past 240 hour(s))  SARS Coronavirus 2 [facility](Hospital order, Performed in [facility] lab) Nasopharyngeal Nasopharyngeal Swab     Status: Abnormal   Collection Time: 01/12/19  9:53 AM   Specimen: Nasopharyngeal Swab  Result Value Ref Range Status   SARS Coronavirus 2 POSITIVE (A) NEGATIVE Final    Comment: RESULT CALLED TO, READ BACK BY AND VERIFIED WITH: SARAH MCCLENDON AT 1114 ON 01/12/2019 [facility]. (NOTE) If result is NEGATIVE SARS-CoV-2 target nucleic acids are NOT DETECTED. The SARS-CoV-2 RNA is generally detectable in upper and lower  respiratory specimens during the acute phase of infection. The lowest   concentration of SARS-CoV-2 viral copies this assay can detect is 250  copies / mL. A negative result does not preclude SARS-CoV-2 infection  and should not be used as the sole basis for treatment or other  patient management decisions.  A negative result may occur with  improper specimen collection / handling, submission of specimen other  than nasopharyngeal swab, presence of viral mutation(s) within the  areas targeted by this assay, and inadequate number of viral copies  (<250 copies / mL). A negative result must be combined with clinical  observations, patient history, and epidemiological information. If result is POSITIVE SARS-CoV-2 target nucleic acids are DETECT ED. The SARS-CoV-2 RNA is generally detectable in upper and lower  respiratory specimens during the acute phase of infection.  Positive  results are indicative of active infection with SARS-CoV-2.  Clinical  correlation with patient history and other diagnostic information is  necessary to determine patient infection status.  Positive results do  not rule out bacterial infection or co-infection with other viruses. If result is PRESUMPTIVE POSTIVE SARS-CoV-2 nucleic acids  MAY BE PRESENT.   A presumptive positive result was obtained on the submitted specimen  and confirmed on repeat testing.  While 2019 novel coronavirus  (SARS-CoV-2) nucleic acids may be present in the submitted sample  additional confirmatory testing may be necessary for epidemiological  and / or clinical management purposes  to differentiate between  SARS-CoV-2 and other Sarbecovirus currently known to infect humans.  If clinically indicated additional testing with an alternate test  methodology (LAB74 53) is advised. The SARS-CoV-2 RNA is generally  detectable in upper and lower respiratory specimens during the acute  phase of infection. The expected result is Negative. Fact Sheet for Patients:  StrictlyIdeas.no Fact Sheet  for Healthcare Providers: BankingDealers.co.za This test is not yet approved or cleared by the [place] FDA and has been authorized for detection and/or diagnosis of SARS-CoV-2 by FDA under an Emergency Use Authorization (EUA).  This EUA will remain in effect (meaning this test can be used) for the duration of the COVID-19 declaration under Section 564(b)(1) of the Act, 21 U.S.C. section 360bbb-3(b)(1), unless the authorization is terminated or revoked sooner. Performed at [facility], [address] [place], [place] [address]   Blood culture (routine x 2)     Status: None (Preliminary result)   Collection Time: 01/12/19  9:53 AM   Specimen: BLOOD  Result Value Ref Range Status   Specimen Description BLOOD BLOOD RIGHT FOREARM  Final   Special Requests   Final    BOTTLES DRAWN AEROBIC AND ANAEROBIC Blood Culture results may not be optimal due to an inadequate volume of blood received in culture bottles   Culture   Final    NO GROWTH < 24 HOURS Performed at [facility], [address], [place], [place] [address]    Report Status PENDING  Incomplete  Blood culture (routine x 2)     Status: None (Preliminary result)   Collection Time: 01/12/19  9:53 AM   Specimen: BLOOD  Result Value Ref Range Status   Specimen Description BLOOD LEFT ARM  Final   Special Requests   Final    BOTTLES DRAWN AEROBIC AND ANAEROBIC Blood Culture results may not be optimal due to an inadequate volume of blood received in culture bottles   Culture   Final    NO GROWTH < 24 HOURS Performed at [facility], [address] [place], [place] [address]    Report Status PENDING  Incomplete       Radiology Studies: Dg Chest Portable 1 View  Result Date: 01/12/2019 CLINICAL DATA:  Shortness of breath EXAM: PORTABLE CHEST 1 VIEW COMPARISON:  10/28/2017 FINDINGS: The heart size and mediastinal contours are stable. Medial right lung base opacity. No  pleural effusion. No pneumothorax. The visualized skeletal structures are unremarkable. IMPRESSION: Medial right lung base opacity may reflect atelectasis versus pneumonia. Electronically Signed   By: Davina Poke M.D.   On: 01/12/2019 10:18    Scheduled Meds: . aspirin EC  81 mg Oral Daily  . dexamethasone (DECADRON) injection  6 mg Intravenous Q24H  . enoxaparin (LOVENOX) injection  1 mg/kg Subcutaneous Q24H  . [START ON 01/14/2019] influenza vaccine adjuvanted  0.5 mL Intramuscular Tomorrow-1000  . insulin aspart  0-5 Units Subcutaneous QHS  . insulin aspart  0-9 Units Subcutaneous TID WC  . sodium chloride flush  3 mL Intravenous Q12H  . vitamin C  500 mg Oral Daily  . zinc sulfate  220 mg Oral Daily   Continuous Infusions: .  sodium chloride    . remdesivir 100 mg in NS 250 mL       LOS: 1 day   Time spent:  Azucena Fallen, DO [facility]  If 7PM-7AM, please contact night-coverage [email or web address] Password [facility] 01/13/2019, 8:18 AM

## 2019-01-14 LAB — CBC WITH DIFFERENTIAL/PLATELET
Abs Immature Granulocytes: 0.1 10*3/uL — ABNORMAL HIGH (ref 0.00–0.07)
Basophils Absolute: 0 10*3/uL (ref 0.0–0.1)
Basophils Relative: 0 %
Eosinophils Absolute: 0 10*3/uL (ref 0.0–0.5)
Eosinophils Relative: 0 %
HCT: 39.3 % (ref 36.0–46.0)
Hemoglobin: 11.8 g/dL — ABNORMAL LOW (ref 12.0–15.0)
Immature Granulocytes: 1 %
Lymphocytes Relative: 7 %
Lymphs Abs: 0.5 10*3/uL — ABNORMAL LOW (ref 0.7–4.0)
MCH: 28.9 pg (ref 26.0–34.0)
MCHC: 30 g/dL (ref 30.0–36.0)
MCV: 96.3 fL (ref 80.0–100.0)
Monocytes Absolute: 0.2 10*3/uL (ref 0.1–1.0)
Monocytes Relative: 2 %
Neutro Abs: 6.7 10*3/uL (ref 1.7–7.7)
Neutrophils Relative %: 90 %
Platelets: 381 10*3/uL (ref 150–400)
RBC: 4.08 MIL/uL (ref 3.87–5.11)
RDW: 13.5 % (ref 11.5–15.5)
WBC: 7.5 10*3/uL (ref 4.0–10.5)
nRBC: 0 % (ref 0.0–0.2)

## 2019-01-14 LAB — C-REACTIVE PROTEIN: CRP: 2.6 mg/dL — ABNORMAL HIGH (ref <1.0)

## 2019-01-14 LAB — GLUCOSE, CAPILLARY
Glucose-Capillary: 186 mg/dL — ABNORMAL HIGH (ref 70–99)
Glucose-Capillary: 219 mg/dL — ABNORMAL HIGH (ref 70–99)
Glucose-Capillary: 265 mg/dL — ABNORMAL HIGH (ref 70–99)

## 2019-01-14 LAB — COMPREHENSIVE METABOLIC PANEL
ALT: 9 U/L (ref 0–44)
AST: 19 U/L (ref 15–41)
Albumin: 2.7 g/dL — ABNORMAL LOW (ref 3.5–5.0)
Alkaline Phosphatase: 61 U/L (ref 38–126)
Anion gap: 7 (ref 5–15)
BUN: 21 mg/dL (ref 8–23)
CO2: 34 mmol/L — ABNORMAL HIGH (ref 22–32)
Calcium: 8.2 mg/dL — ABNORMAL LOW (ref 8.9–10.3)
Chloride: 94 mmol/L — ABNORMAL LOW (ref 98–111)
Creatinine, Ser: 0.34 mg/dL — ABNORMAL LOW (ref 0.44–1.00)
GFR calc Af Amer: >60 mL/min (ref >60)
GFR calc non Af Amer: >60 mL/min (ref >60)
Glucose, Bld: 226 mg/dL — ABNORMAL HIGH (ref 70–99)
Potassium: 4.1 mmol/L (ref 3.5–5.1)
Sodium: 135 mmol/L (ref 135–145)
Total Bilirubin: 0.2 mg/dL — ABNORMAL LOW (ref 0.3–1.2)
Total Protein: 5.6 g/dL — ABNORMAL LOW (ref 6.5–8.1)

## 2019-01-14 LAB — FERRITIN: Ferritin: 197 ng/mL (ref 11–307)

## 2019-01-14 LAB — LACTATE DEHYDROGENASE: LDH: 111 U/L (ref 98–192)

## 2019-01-14 MED ORDER — LISINOPRIL 10 MG PO TABS
10.0000 mg | ORAL_TABLET | Freq: Every day | ORAL | Status: DC
  Administered 2019-01-14 – 2019-01-17 (×4): 10 mg via ORAL
  Filled 2019-01-14 (×4): qty 1

## 2019-01-14 MED ORDER — FEBUXOSTAT 40 MG PO TABS
40.0000 mg | ORAL_TABLET | Freq: Every day | ORAL | Status: DC
  Administered 2019-01-14 – 2019-01-17 (×4): 40 mg via ORAL
  Filled 2019-01-14 (×5): qty 1

## 2019-01-14 MED ORDER — POLYETHYLENE GLYCOL 3350 17 G PO PACK
17.0000 g | PACK | Freq: Every day | ORAL | Status: DC
  Administered 2019-01-14 – 2019-01-16 (×3): 17 g via ORAL
  Filled 2019-01-14 (×3): qty 1

## 2019-01-14 MED ORDER — AMLODIPINE BESYLATE 5 MG PO TABS
5.0000 mg | ORAL_TABLET | Freq: Every day | ORAL | Status: DC
  Administered 2019-01-14 – 2019-01-17 (×4): 5 mg via ORAL
  Filled 2019-01-14 (×4): qty 1

## 2019-01-14 MED ORDER — ATENOLOL 25 MG PO TABS
25.0000 mg | ORAL_TABLET | Freq: Every day | ORAL | Status: DC
  Administered 2019-01-14 – 2019-01-17 (×4): 25 mg via ORAL
  Filled 2019-01-14 (×5): qty 1

## 2019-01-14 NOTE — Plan of Care (Signed)
Spoke with Husband to discuss patient current status and POC

## 2019-01-14 NOTE — Progress Notes (Signed)
PROGRESS NOTE    Priscilla Johnson  KGU:[id number] DOB: [birth year] DOA: 01/12/2019 PCP: Gabriel Cirri, NP   Brief Narrative:  Priscilla Johnson is a 73 y.o. female coming from SNF via [facility], with medical history significant of DM2, HTN and chronic hypoxia (unclear etiology) on 3LNC around the clock, who presents with one week of worsening sob and cough.  No fevers.  No n/v/ but does report some diarrhea.  No abd pain or chest pain.  Referred for admit for covid pna.  Has been on po abx and steroids for last 4 days and not better. Requiring >3L [place] with exertion per ED/facility sign out.     Assessment & Plan:   Principal Problem:   Pneumonia due to COVID-19 virus Active Problems:   Severe obesity (HCC)   CKD (chronic kidney disease), stage III (HCC)   Hyperlipidemia   Essential hypertension   Chronic respiratory failure with hypoxia (HCC)   Pneumonia due to 2019-nCoV   Acute on chronic hypoxic respiratory failure in the setting of COVID-19 pneumonia, POA -Signout patient was hypoxic on baseline 3 L nasal cannula, now appears to be more stable on home regimen of 3 L nasal cannula -Continue Remdesivir, stop date 01/16/2019, Decadron per protocol -Continue supportive care, pronating as tolerated, patient poorly ambulatory at baseline, exertional hypoxia will be difficult to obtain Recent Labs    01/12/19 2050 01/14/19 0243  DDIMER 0.52*  --   FERRITIN  --  197  LDH  --  111  CRP 5.9* 2.6*   Elevated troponin, likely supply demand mismatch  -She denies chest pain, likely in the setting of above, continue to follow clinically -EKG unremarkable  Reported history of CKD 3 -Creatinine currently within normal limits, follow with morning labs  Hypertension, essential -Well-controlled, resume home medications  Hyperlipidemia -Continue home medications  Severe obesity -Lengthy discussion at bedside about dietary and lifestyle changes   DVT prophylaxis: Lovenox Code Status:  Full Family Communication: None available Disposition Plan: Inpatient -patient continues to require IV steroids, Remdesivir and close monitoring given acute COVID-19 infection as above   Subjective: No acute issues or events overnight, patient remains markedly weak, short of breath even at rest.  Denies chest pain, nausea, vomiting, diarrhea, constipation, headache, fevers, chills.  Objective: Vitals:   01/13/19 1900 01/13/19 2000 01/13/19 2100 01/13/19 2300  BP: 114/78 110/66    Pulse: 85 96 92 84  Resp: 20 (!) 24 (!) 21 18  Temp: 97.9 F (36.6 C)     TempSrc: Oral     SpO2:  99% 100% 100%  Weight:      Height:        Intake/Output Summary (Last 24 hours) at 01/14/2019 0859 Last data filed at 01/14/2019 0600 Gross per 24 hour  Intake 263.11 ml  Output 250 ml  Net 13.11 ml   Filed Weights   01/12/19 1943  Weight: 68.4 kg    Examination:  General:  Pleasantly resting in bed, No acute distress. HEENT:  Normocephalic atraumatic.  Sclerae nonicteric, noninjected.  Extraocular movements intact bilaterally. Neck:  Without mass or deformity.  Trachea is midline. Lungs:  Clear to auscultate bilaterally without rhonchi, wheeze, or rales. Heart:  Regular rate and rhythm.  Without murmurs, rubs, or gallops. Abdomen:  Soft, nontender, nondistended.  Without guarding or rebound. Extremities: Without cyanosis, clubbing, edema, or obvious deformity. Vascular:  Dorsalis pedis and posterior tibial pulses palpable bilaterally. Skin:  Warm and dry, no erythema, no ulcerations.   Data Reviewed: I  have personally reviewed following labs and imaging studies  CBC: Recent Labs  Lab 01/12/19 0953 01/13/19 0320 01/14/19 0243  WBC 13.7* 5.5 7.5  NEUTROABS 11.8* 4.7 6.7  HGB 13.2 11.5* 11.8*  HCT 41.9 37.6 39.3  MCV 92.5 95.7 96.3  PLT 437* 334 381   Basic Metabolic Panel: Recent Labs  Lab 01/12/19 0953 01/12/19 2050 01/13/19 0320 01/14/19 0243  NA 132* 137 136 135  K 4.2 4.2  3.5 4.1  CL 86* 97* 96* 94*  CO2 30 28 31  34*  GLUCOSE 210* 187* 237* 226*  BUN 16 18 16 21   CREATININE 0.45 0.39* 0.35* 0.34*  CALCIUM 8.8* 8.1* 8.3* 8.2*   GFR: Estimated Creatinine Clearance: 55.9 mL/min (A) (by C-G formula based on SCr of 0.34 mg/dL (L)). Liver Function Tests: Recent Labs  Lab 01/12/19 2050 01/13/19 0320 01/14/19 0243  AST 20 17 19   ALT 9 8 9   ALKPHOS 56 55 61  BILITOT 0.4 0.2* 0.2*  PROT 5.7* 5.6* 5.6*  ALBUMIN 2.6* 2.5* 2.7*   No results for input(s): LIPASE, AMYLASE in the last 168 hours. No results for input(s): AMMONIA in the last 168 hours. Coagulation Profile: No results for input(s): INR, PROTIME in the last 168 hours. Cardiac Enzymes: No results for input(s): CKTOTAL, CKMB, CKMBINDEX, TROPONINI in the last 168 hours. BNP (last 3 results) No results for input(s): PROBNP in the last 8760 hours. HbA1C: Recent Labs    01/12/19 2250  HGBA1C 5.5   CBG: Recent Labs  Lab 01/13/19 0810 01/13/19 1208 01/13/19 1610 01/13/19 2031 01/14/19 0729  GLUCAP 160* 160* 202* 255* 186*   Lipid Profile: No results for input(s): CHOL, HDL, LDLCALC, TRIG, CHOLHDL, LDLDIRECT in the last 72 hours. Thyroid Function Tests: No results for input(s): TSH, T4TOTAL, FREET4, T3FREE, THYROIDAB in the last 72 hours. Anemia Panel: Recent Labs    01/14/19 0243  FERRITIN 197   Sepsis Labs: Recent Labs  Lab 01/12/19 0953 01/12/19 2050  PROCALCITON  --  0.15  LATICACIDVEN 2.7* 1.2    Recent Results (from the past 240 hour(s))  SARS Coronavirus 2 [facility](Hospital order, Performed in [facility] hospital lab) Nasopharyngeal Nasopharyngeal Swab     Status: Abnormal   Collection Time: 01/12/19  9:53 AM   Specimen: Nasopharyngeal Swab  Result Value Ref Range Status   SARS Coronavirus 2 POSITIVE (A) NEGATIVE Final    Comment: RESULT CALLED TO, READ BACK BY AND VERIFIED WITH: SARAH MCCLENDON AT 1114 ON 01/12/2019 [facility]. (NOTE) If result is NEGATIVE SARS-CoV-2 target  nucleic acids are NOT DETECTED. The SARS-CoV-2 RNA is generally detectable in upper and lower  respiratory specimens during the acute phase of infection. The lowest  concentration of SARS-CoV-2 viral copies this assay can detect is 250  copies / mL. A negative result does not preclude SARS-CoV-2 infection  and should not be used as the sole basis for treatment or other  patient management decisions.  A negative result may occur with  improper specimen collection / handling, submission of specimen other  than nasopharyngeal swab, presence of viral mutation(s) within the  areas targeted by this assay, and inadequate number of viral copies  (<250 copies / mL). A negative result must be combined with clinical  observations, patient history, and epidemiological information. If result is POSITIVE SARS-CoV-2 target nucleic acids are DETECT ED. The SARS-CoV-2 RNA is generally detectable in upper and lower  respiratory specimens during the acute phase of infection.  Positive  results are indicative of active  infection with SARS-CoV-2.  Clinical  correlation with patient history and other diagnostic information is  necessary to determine patient infection status.  Positive results do  not rule out bacterial infection or co-infection with other viruses. If result is PRESUMPTIVE POSTIVE SARS-CoV-2 nucleic acids MAY BE PRESENT.   A presumptive positive result was obtained on the submitted specimen  and confirmed on repeat testing.  While 2019 novel coronavirus  (SARS-CoV-2) nucleic acids may be present in the submitted sample  additional confirmatory testing may be necessary for epidemiological  and / or clinical management purposes  to differentiate between  SARS-CoV-2 and other Sarbecovirus currently known to infect humans.  If clinically indicated additional testing with an alternate test  methodology (LAB74 53) is advised. The SARS-CoV-2 RNA is generally  detectable in upper and lower  respiratory specimens during the acute  phase of infection. The expected result is Negative. Fact Sheet for Patients:  StrictlyIdeas.no Fact Sheet for Healthcare Providers: BankingDealers.co.za This test is not yet approved or cleared by the [place] FDA and has been authorized for detection and/or diagnosis of SARS-CoV-2 by FDA under an Emergency Use Authorization (EUA).  This EUA will remain in effect (meaning this test can be used) for the duration of the COVID-19 declaration under Section 564(b)(1) of the Act, 21 U.S.C. section 360bbb-3(b)(1), unless the authorization is terminated or revoked sooner. Performed at [facility], [address] [place], [place] [address]   Blood culture (routine x 2)     Status: None (Preliminary result)   Collection Time: 01/12/19  9:53 AM   Specimen: BLOOD  Result Value Ref Range Status   Specimen Description BLOOD BLOOD RIGHT FOREARM  Final   Special Requests   Final    BOTTLES DRAWN AEROBIC AND ANAEROBIC Blood Culture results may not be optimal due to an inadequate volume of blood received in culture bottles   Culture   Final    NO GROWTH 2 DAYS Performed at [facility], [address], [place], [place] [address]    Report Status PENDING  Incomplete  Blood culture (routine x 2)     Status: None (Preliminary result)   Collection Time: 01/12/19  9:53 AM   Specimen: BLOOD  Result Value Ref Range Status   Specimen Description BLOOD LEFT ARM  Final   Special Requests   Final    BOTTLES DRAWN AEROBIC AND ANAEROBIC Blood Culture results may not be optimal due to an inadequate volume of blood received in culture bottles   Culture   Final    NO GROWTH 2 DAYS Performed at [facility], [address] [place], [place] [address]    Report Status PENDING  Incomplete       Radiology Studies: Dg Chest Portable 1 View  Result Date: 01/12/2019 CLINICAL DATA:   Shortness of breath EXAM: PORTABLE CHEST 1 VIEW COMPARISON:  10/28/2017 FINDINGS: The heart size and mediastinal contours are stable. Medial right lung base opacity. No pleural effusion. No pneumothorax. The visualized skeletal structures are unremarkable. IMPRESSION: Medial right lung base opacity may reflect atelectasis versus pneumonia. Electronically Signed   By: Davina Poke M.D.   On: 01/12/2019 10:18    Scheduled Meds: . aspirin EC  81 mg Oral Daily  . dexamethasone (DECADRON) injection  6 mg Intravenous Q24H  . enoxaparin (LOVENOX) injection  40 mg Subcutaneous Q24H  . influenza vaccine adjuvanted  0.5 mL Intramuscular Tomorrow-1000  . insulin aspart  0-5 Units Subcutaneous QHS  . insulin  aspart  0-9 Units Subcutaneous TID WC  . sodium chloride flush  3 mL Intravenous Q12H  . vitamin C  500 mg Oral Daily  . zinc sulfate  220 mg Oral Daily   Continuous Infusions: . sodium chloride    . remdesivir 100 mg in NS 250 mL 100 mg (01/13/19 1608)     LOS: 2 days   Time spent:  Azucena Fallen, DO [facility]  If 7PM-7AM, please contact night-coverage [email or web address] Password [email or web address] 01/14/2019, 8:59 AM

## 2019-01-14 NOTE — TOC Initial Note (Signed)
Transition of Care [facility]) - Initial/Assessment Note    Patient Details  Name: Priscilla Johnson MRN: [id number] Date of Birth: Nov 12, 1945  Transition of [facility]) CM/SW Contact:    Alberteen Sam, [place] Phone Number: [phone] 01/14/2019, 3:24 PM  Clinical Narrative:                  CSW consulted with patient's husband who confirmed patient is from [facility] and has been there for 13 months. He reports she had continued to fall multiple times and he was unable to take care of her. He states him and patient have discussed looking potentially at  A new facility such as [facility] or [place]. He asked CSW to send referrals to these facilities, however if they do not accept he is agreeable to patient returning to [facility].   CSW will continue to follow for discharge planning needs.    Expected Discharge Plan: Skilled Nursing Facility Barriers to Discharge: Continued Medical Work up   Patient Goals and CMS Choice   CMS Medicare.gov Compare Post Acute Care list provided to:: Patient Represenative (must comment)(husband [name]) Choice offered to / list presented to : Spouse  Expected Discharge Plan and Services Expected Discharge Plan: [place] Acute Care Choice: [place] Living arrangements for the past 2 months: [place]                                      Prior Living Arrangements/Services Living arrangements for the past 2 months: [place] Lives with:: Self Patient language and need for interpreter reviewed:: Yes Do you feel safe going back to the place where you live?: Yes      Need for Family Participation in Patient Care: Yes (Comment) Care giver support system in place?: Yes (comment)   Criminal Activity/Legal Involvement Pertinent to Current Situation/Hospitalization: No - Comment as needed  Activities of Daily Living Home Assistive Devices/Equipment:  Eyeglasses, Dentures (specify type) ADL Screening (condition at time of admission) Patient's cognitive ability adequate to safely complete daily activities?: Yes Is the patient deaf or have difficulty hearing?: No Does the patient have difficulty seeing, even when wearing glasses/contacts?: No Does the patient have difficulty concentrating, remembering, or making decisions?: No Patient able to express need for assistance with ADLs?: Yes Does the patient have difficulty dressing or bathing?: Yes Independently performs ADLs?: No Does the patient have difficulty walking or climbing stairs?: Yes Weakness of Legs: Both Weakness of Arms/Hands: Both  Permission Sought/Granted Permission sought to share information with : Case Manager, Customer service manager, Family Supports Permission granted to share information with : Yes, Verbal Permission Granted  Share Information with NAME: [name]  Permission granted to share info w AGENCY: SNFs  Permission granted to share info w Relationship: husband  Permission granted to share info w Contact Information: [phone]  Emotional Assessment Appearance:: Appears stated age Attitude/Demeanor/Rapport: Unable to Assess Affect (typically observed): Unable to Assess Orientation: : Oriented to Self, Oriented to Place, Oriented to  Time, Oriented to Situation Alcohol / Substance Use: Not Applicable Psych Involvement: No (comment)  Admission diagnosis:  PNEUMONIA Patient Active Problem List   Diagnosis Date Noted  . Pneumonia due to COVID-19 virus 01/12/2019  . Chronic respiratory failure with hypoxia ([place]) 01/12/2019  . Pneumonia due to 2019-nCoV 01/12/2019  . Toe fracture 10/26/2017  .  Anemia 07/21/2016  . Advanced care planning/counseling discussion 07/20/2016  . Gout 07/20/2016  . Essential hypertension 08/23/2015  . Severe obesity (BMI 35.0-35.9 with comorbidity) (HCC) 04/18/2015  . Insomnia 01/16/2015  . Sleep apnea 01/16/2015  .  Type II diabetes mellitus with renal manifestations (HCC) 01/16/2015  . Hypertensive CKD (chronic kidney disease) 01/16/2015  . Severe obesity (HCC) 01/16/2015  . CKD (chronic kidney disease), stage III (HCC) 01/16/2015  . Hyperlipidemia 01/16/2015   PCP:  Gabriel Cirri, NP Pharmacy:   [facility] [place] - [address] [facility] [address] [place] [address] [place] [address] Phone: [phone] Fax: [phone]     Social Determinants of Health (SDOH) Interventions    Readmission Risk Interventions No flowsheet data found.

## 2019-01-14 NOTE — NC FL2 (Signed)
[place] MEDICAID FL2 LEVEL OF CARE SCREENING TOOL     IDENTIFICATION  Patient Name: Priscilla Johnson Birthdate: [birth year] Sex: female Admission Date (Current Location): 01/12/2019  [facility] and [place] Number:  Producer, television/film/video and Address:  [facility] [place], [address] [place], [place] [address]([facility])      Provider Number: [id number]  Attending Physician Name and Address:  Azucena Fallen, MD  Relative Name and Phone Number:  Dorinda Yvette Roark (spouse)[phone]    Current Level of Care: Hospital Recommended Level of Care: Skilled Nursing Facility Prior Approval Number:    Date Approved/Denied:   PASRR Number: [id number]  Discharge Plan: SNF    Current Diagnoses: Patient Active Problem List   Diagnosis Date Noted  . Pneumonia due to COVID-19 virus 01/12/2019  . Chronic respiratory failure with hypoxia (HCC) 01/12/2019  . Pneumonia due to 2019-nCoV 01/12/2019  . Toe fracture 10/26/2017  . Anemia 07/21/2016  . Advanced care planning/counseling discussion 07/20/2016  . Gout 07/20/2016  . Essential hypertension 08/23/2015  . Severe obesity (BMI 35.0-35.9 with comorbidity) (HCC) 04/18/2015  . Insomnia 01/16/2015  . Sleep apnea 01/16/2015  . Type II diabetes mellitus with renal manifestations (HCC) 01/16/2015  . Hypertensive CKD (chronic kidney disease) 01/16/2015  . Severe obesity (HCC) 01/16/2015  . CKD (chronic kidney disease), stage III (HCC) 01/16/2015  . Hyperlipidemia 01/16/2015    Orientation RESPIRATION BLADDER Height & Weight     Self, Time, Situation, Place  O2(nasal cannula 3L/min) Incontinent, External catheter Weight: 150 lb 12.7 oz (68.4 kg) Height:  5\' 2"  (157.5 cm)  BEHAVIORAL SYMPTOMS/MOOD NEUROLOGICAL BOWEL NUTRITION STATUS      Continent Diet(see discharge summary)  AMBULATORY STATUS COMMUNICATION OF NEEDS Skin   Extensive Assist Verbally Normal                       Personal Care Assistance  Level of Assistance  Bathing, Feeding, Dressing, Total care Bathing Assistance: Limited assistance Feeding assistance: Limited assistance Dressing Assistance: Limited assistance Total Care Assistance: Maximum assistance   Functional Limitations Info  Sight, Hearing, Speech Sight Info: Adequate Hearing Info: Adequate Speech Info: Adequate(dentures)    SPECIAL CARE FACTORS FREQUENCY  PT (By licensed PT), OT (By licensed OT)     PT Frequency: min 5x weekly OT Frequency: min 5x weekly            Contractures Contractures Info: Not present    Additional Factors Info  Code Status, Allergies, Isolation Precautions Code Status Info: full Allergies Info: No Known Allergies     Isolation Precautions Info: emerging pathogen, air and contact precautions     Current Medications (01/14/2019):  This is the current hospital active medication list Current Facility-Administered Medications  Medication Dose Route Frequency Provider Last Rate Last Dose  . 0.9 %  sodium chloride infusion  250 mL Intravenous PRN 01/16/2019, MD      . acetaminophen (TYLENOL) tablet 650 mg  650 mg Oral Q6H PRN Haydee Monica, MD   650 mg at 01/13/19 1531  . amLODipine (NORVASC) tablet 5 mg  5 mg Oral Daily 01/15/19, MD      . aspirin EC tablet 81 mg  81 mg Oral Daily Azucena Fallen A, MD   81 mg at 01/14/19 0804  . atenolol (TENORMIN) tablet 25 mg  25 mg Oral Daily 01/16/19, MD      . dexamethasone (DECADRON) injection 6 mg  6 mg  Intravenous Q24H Phillips Grout, MD   6 mg at 01/13/19 2110  . enoxaparin (LOVENOX) injection 40 mg  40 mg Subcutaneous Q24H Little Ishikawa, MD   40 mg at 01/13/19 2110  . febuxostat (ULORIC) tablet 40 mg  40 mg Oral Daily Little Ishikawa, MD      . influenza vaccine adjuvanted (FLUAD) injection 0.5 mL  0.5 mL Intramuscular Tomorrow-1000 Little Ishikawa, MD      . insulin aspart (novoLOG) injection 0-5 Units  0-5 Units Subcutaneous QHS  Phillips Grout, MD   3 Units at 01/13/19 2110  . insulin aspart (novoLOG) injection 0-9 Units  0-9 Units Subcutaneous TID WC Phillips Grout, MD   3 Units at 01/14/19 1151  . lisinopril (ZESTRIL) tablet 10 mg  10 mg Oral Daily Little Ishikawa, MD      . ondansetron [facility]) tablet 4 mg  4 mg Oral Q6H PRN Phillips Grout, MD       Or  . ondansetron (ZOFRAN) injection 4 mg  4 mg Intravenous Q6H PRN Phillips Grout, MD      . polyethylene glycol (MIRALAX / GLYCOLAX) packet 17 g  17 g Oral Daily Little Ishikawa, MD      . remdesivir 100 mg in sodium chloride 0.9 % 250 mL IVPB  100 mg Intravenous Q24H Reginia Naas, RPH 500 mL/hr at 01/13/19 1608 100 mg at 01/13/19 1608  . sodium chloride flush (NS) 0.9 % injection 3 mL  3 mL Intravenous Q12H Derrill Kay A, MD   3 mL at 01/14/19 0807  . sodium chloride flush (NS) 0.9 % injection 3 mL  3 mL Intravenous PRN Derrill Kay A, MD      . vitamin C (ASCORBIC ACID) tablet 500 mg  500 mg Oral Daily Derrill Kay A, MD   500 mg at 01/14/19 0804  . zinc sulfate capsule 220 mg  220 mg Oral Daily Phillips Grout, MD   220 mg at 01/14/19 0803     Discharge Medications: Please see discharge summary for a list of discharge medications.  Relevant Imaging Results:  Relevant Lab Results:   Additional Information SSN: [id number]  Alberteen Sam, LCSW

## 2019-01-15 ENCOUNTER — Inpatient Hospital Stay (HOSPITAL_COMMUNITY): Payer: Medicare Other

## 2019-01-15 LAB — CBC WITH DIFFERENTIAL/PLATELET
Abs Immature Granulocytes: 0.07 10*3/uL (ref 0.00–0.07)
Basophils Absolute: 0 10*3/uL (ref 0.0–0.1)
Basophils Relative: 0 %
Eosinophils Absolute: 0 10*3/uL (ref 0.0–0.5)
Eosinophils Relative: 0 %
HCT: 38.8 % (ref 36.0–46.0)
Hemoglobin: 11.9 g/dL — ABNORMAL LOW (ref 12.0–15.0)
Immature Granulocytes: 1 %
Lymphocytes Relative: 9 %
Lymphs Abs: 0.6 10*3/uL — ABNORMAL LOW (ref 0.7–4.0)
MCH: 29.5 pg (ref 26.0–34.0)
MCHC: 30.7 g/dL (ref 30.0–36.0)
MCV: 96.3 fL (ref 80.0–100.0)
Monocytes Absolute: 0.2 10*3/uL (ref 0.1–1.0)
Monocytes Relative: 2 %
Neutro Abs: 5.4 10*3/uL (ref 1.7–7.7)
Neutrophils Relative %: 88 %
Platelets: 350 10*3/uL (ref 150–400)
RBC: 4.03 MIL/uL (ref 3.87–5.11)
RDW: 13.7 % (ref 11.5–15.5)
WBC: 6.2 10*3/uL (ref 4.0–10.5)
nRBC: 0 % (ref 0.0–0.2)

## 2019-01-15 LAB — GLUCOSE, CAPILLARY
Glucose-Capillary: 235 mg/dL — ABNORMAL HIGH (ref 70–99)
Glucose-Capillary: 210 mg/dL — ABNORMAL HIGH (ref 70–99)
Glucose-Capillary: 258 mg/dL — ABNORMAL HIGH (ref 70–99)
Glucose-Capillary: 269 mg/dL — ABNORMAL HIGH (ref 70–99)
Glucose-Capillary: 256 mg/dL — ABNORMAL HIGH (ref 70–99)
Glucose-Capillary: 130 mg/dL — ABNORMAL HIGH (ref 70–99)

## 2019-01-15 LAB — COMPREHENSIVE METABOLIC PANEL
ALT: 9 U/L (ref 0–44)
AST: 16 U/L (ref 15–41)
Albumin: 2.5 g/dL — ABNORMAL LOW (ref 3.5–5.0)
Alkaline Phosphatase: 55 U/L (ref 38–126)
Anion gap: 7 (ref 5–15)
BUN: 28 mg/dL — ABNORMAL HIGH (ref 8–23)
CO2: 34 mmol/L — ABNORMAL HIGH (ref 22–32)
Calcium: 7.9 mg/dL — ABNORMAL LOW (ref 8.9–10.3)
Chloride: 95 mmol/L — ABNORMAL LOW (ref 98–111)
Creatinine, Ser: 0.38 mg/dL — ABNORMAL LOW (ref 0.44–1.00)
GFR calc Af Amer: >60 mL/min (ref >60)
GFR calc non Af Amer: >60 mL/min (ref >60)
Glucose, Bld: 241 mg/dL — ABNORMAL HIGH (ref 70–99)
Potassium: 4 mmol/L (ref 3.5–5.1)
Sodium: 136 mmol/L (ref 135–145)
Total Bilirubin: 0.1 mg/dL — ABNORMAL LOW (ref 0.3–1.2)
Total Protein: 5.1 g/dL — ABNORMAL LOW (ref 6.5–8.1)

## 2019-01-15 LAB — LACTATE DEHYDROGENASE: LDH: 88 U/L — ABNORMAL LOW (ref 98–192)

## 2019-01-15 LAB — C-REACTIVE PROTEIN: CRP: 1.1 mg/dL — ABNORMAL HIGH (ref <1.0)

## 2019-01-15 LAB — FERRITIN: Ferritin: 174 ng/mL (ref 11–307)

## 2019-01-15 MED ORDER — ALBUTEROL SULFATE HFA 108 (90 BASE) MCG/ACT IN AERS
2.0000 | INHALATION_SPRAY | RESPIRATORY_TRACT | Status: DC
  Administered 2019-01-15 – 2019-01-17 (×10): 2 via RESPIRATORY_TRACT
  Filled 2019-01-15: qty 6.7

## 2019-01-15 MED ORDER — LORAZEPAM 0.5 MG PO TABS
0.5000 mg | ORAL_TABLET | Freq: Once | ORAL | Status: AC
  Administered 2019-01-15: 18:00:00 0.5 mg via ORAL
  Filled 2019-01-15: qty 1

## 2019-01-15 NOTE — Progress Notes (Signed)
Called to pt room, pt reports unable to breath. Pt breathing shallow and fast. VSS taken. Mews at a red. Notified Primary RN and MD. EKG obtained. RR called. CXRay obtained. See New orders

## 2019-01-15 NOTE — Progress Notes (Signed)
PROGRESS NOTE    Priscilla Johnson  SEG:[id number] DOB: 05-Dec-1945 DOA: 01/12/2019 PCP: Gabriel Cirri, NP   Brief Narrative:  Priscilla Johnson is a 73 y.o. female coming from SNF via [facility], with medical history significant of DM2, HTN and chronic hypoxia (unclear etiology) on 3LNC around the clock, who presents with one week of worsening sob and cough.  No fevers.  No n/v/ but does report some diarrhea.  No abd pain or chest pain.  Referred for admit for covid pna.  Has been on po abx and steroids for last 4 days and not better. Requiring >3L [place] with exertion per ED/facility sign out.     Assessment & Plan:   Principal Problem:   Pneumonia due to COVID-19 virus Active Problems:   Severe obesity (HCC)   CKD (chronic kidney disease), stage III (HCC)   Hyperlipidemia   Essential hypertension   Chronic respiratory failure with hypoxia (HCC)   Pneumonia due to 2019-nCoV   Acute on chronic hypoxic respiratory failure in the setting of COVID-19 pneumonia, POA -At intake patient was hypoxic to the 80s on baseline 3 L nasal cannula, now appears to be more stable on home regimen of 3 L nasal cannula at rest -Continue Remdesivir, stop date 01/16/2019, Decadron per protocol -Continue supportive care, pronating as tolerated, patient poorly ambulatory at baseline, exertional hypoxia will be difficult to obtain -Unclear etiology for chronic hypoxia - does report work in Designer, 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 Recent Labs    01/12/19 2050 01/14/19 0243 01/15/19 0229  DDIMER 0.52*  --   --   FERRITIN  --  197 174  LDH  --  111 88*  CRP 5.9* 2.6* 1.1*   Elevated troponin, likely supply demand mismatch  -She denies chest pain, likely in the setting of above, continue to follow clinically -EKG unremarkable  Reported history of CKD 3 -Creatinine currently within normal limits, follow with morning labs  Hypertension, essential -Well-controlled, resume home medications   Hyperlipidemia -Continue home medications  Severe obesity -Lengthy discussion at bedside about dietary and lifestyle changes   DVT prophylaxis: Lovenox Code Status: Full Family Communication: None available Disposition Plan: Inpatient -patient continues to require IV steroids, Remdesivir and close monitoring given acute COVID-19 infection as above   Subjective: No acute issues or events overnight, patient remains markedly weak and short of breath even at rest but minimally improving.  Denies chest pain, nausea, vomiting, diarrhea, constipation, headache, fevers, chills.  Objective: Vitals:   01/14/19 2050 01/15/19 0440 01/15/19 0445 01/15/19 0749  BP: (!) 110/57 (!) 93/53 (!) 99/59 113/71  Pulse: 83 80  78  Resp: 18 18  18   Temp: (!) 97.4 F (36.3 C) 97.7 F (36.5 C)  98 F (36.7 C)  TempSrc: Oral Oral  Oral  SpO2:    98%  Weight:      Height:        Intake/Output Summary (Last 24 hours) at 01/15/2019 0812 Last data filed at 01/15/2019 0727 Gross per 24 hour  Intake 259.22 ml  Output 750 ml  Net -490.78 ml   Filed Weights   01/12/19 1943  Weight: 68.4 kg    Examination:  General:  Pleasantly resting in bed, No acute distress. HEENT:  Normocephalic atraumatic.  Sclerae nonicteric, noninjected.  Extraocular movements intact bilaterally. Neck:  Without mass or deformity.  Trachea is midline. Lungs:  Clear to auscultate bilaterally without rhonchi, wheeze, or rales. Heart:  Regular rate and rhythm.  Without murmurs, rubs,  or gallops. Abdomen:  Soft, nontender, nondistended.  Without guarding or rebound. Extremities: Without cyanosis, clubbing, edema, or obvious deformity. Vascular:  Dorsalis pedis and posterior tibial pulses palpable bilaterally. Skin:  Warm and dry, no erythema, no ulcerations.   Data Reviewed: I have personally reviewed following labs and imaging studies  CBC: Recent Labs  Lab 01/12/19 0953 01/13/19 0320 01/14/19 0243 01/15/19 0229  WBC  13.7* 5.5 7.5 6.2  NEUTROABS 11.8* 4.7 6.7 5.4  HGB 13.2 11.5* 11.8* 11.9*  HCT 41.9 37.6 39.3 38.8  MCV 92.5 95.7 96.3 96.3  PLT 437* 334 381 350   Basic Metabolic Panel: Recent Labs  Lab 01/12/19 0953 01/12/19 2050 01/13/19 0320 01/14/19 0243 01/15/19 0229  NA 132* 137 136 135 136  K 4.2 4.2 3.5 4.1 4.0  CL 86* 97* 96* 94* 95*  CO2 30 28 31  34* 34*  GLUCOSE 210* 187* 237* 226* 241*  BUN 16 18 16 21  28*  CREATININE 0.45 0.39* 0.35* 0.34* 0.38*  CALCIUM 8.8* 8.1* 8.3* 8.2* 7.9*   GFR: Estimated Creatinine Clearance: 55.9 mL/min (A) (by C-G formula based on SCr of 0.38 mg/dL (L)). Liver Function Tests: Recent Labs  Lab 01/12/19 2050 01/13/19 0320 01/14/19 0243 01/15/19 0229  AST 20 17 19 16   ALT 9 8 9 9   ALKPHOS 56 55 61 55  BILITOT 0.4 0.2* 0.2* 0.1*  PROT 5.7* 5.6* 5.6* 5.1*  ALBUMIN 2.6* 2.5* 2.7* 2.5*   No results for input(s): LIPASE, AMYLASE in the last 168 hours. No results for input(s): AMMONIA in the last 168 hours. Coagulation Profile: No results for input(s): INR, PROTIME in the last 168 hours. Cardiac Enzymes: No results for input(s): CKTOTAL, CKMB, CKMBINDEX, TROPONINI in the last 168 hours. BNP (last 3 results) No results for input(s): PROBNP in the last 8760 hours. HbA1C: Recent Labs    01/12/19 2250  HGBA1C 5.5   CBG: Recent Labs  Lab 01/13/19 2031 01/14/19 0729 01/14/19 1051 01/14/19 1619 01/14/19 2140  GLUCAP 255* 186* 235* 219* 265*   Lipid Profile: No results for input(s): CHOL, HDL, LDLCALC, TRIG, CHOLHDL, LDLDIRECT in the last 72 hours. Thyroid Function Tests: No results for input(s): TSH, T4TOTAL, FREET4, T3FREE, THYROIDAB in the last 72 hours. Anemia Panel: Recent Labs    01/14/19 0243 01/15/19 0229  FERRITIN 197 174   Sepsis Labs: Recent Labs  Lab 01/12/19 0953 01/12/19 2050  PROCALCITON  --  0.15  LATICACIDVEN 2.7* 1.2    Recent Results (from the past 240 hour(s))  SARS Coronavirus 2 [facility](Hospital order,  Performed [facility]) Nasopharyngeal Nasopharyngeal Swab     Status: Abnormal   Collection Time: 01/12/19  9:53 AM   Specimen: Nasopharyngeal Swab  Result Value Ref Range Status   SARS Coronavirus 2 POSITIVE (A) NEGATIVE Final    Comment: RESULT CALLED TO, READ BACK BY AND VERIFIED WITH: SARAH MCCLENDON AT 1114 ON 01/12/2019 [facility]. (NOTE) If result is NEGATIVE SARS-CoV-2 target nucleic acids are NOT DETECTED. The SARS-CoV-2 RNA is generally detectable in upper and lower  respiratory specimens during the acute phase of infection. The lowest  concentration of SARS-CoV-2 viral copies this assay can detect is 250  copies / mL. A negative result does not preclude SARS-CoV-2 infection  and should not be used as the sole basis for treatment or other  patient management decisions.  A negative result may occur with  improper specimen collection / handling, submission of specimen other  than nasopharyngeal swab, presence of viral  mutation(s) within the  areas targeted by this assay, and inadequate number of viral copies  (<250 copies / mL). A negative result must be combined with clinical  observations, patient history, and epidemiological information. If result is POSITIVE SARS-CoV-2 target nucleic acids are DETECT ED. The SARS-CoV-2 RNA is generally detectable in upper and lower  respiratory specimens during the acute phase of infection.  Positive  results are indicative of active infection with SARS-CoV-2.  Clinical  correlation with patient history and other diagnostic information is  necessary to determine patient infection status.  Positive results do  not rule out bacterial infection or co-infection with other viruses. If result is PRESUMPTIVE POSTIVE SARS-CoV-2 nucleic acids MAY BE PRESENT.   A presumptive positive result was obtained on the submitted specimen  and confirmed on repeat testing.  While 2019 novel coronavirus  (SARS-CoV-2) nucleic acids may be present in  the submitted sample  additional confirmatory testing may be necessary for epidemiological  and / or clinical management purposes  to differentiate between  SARS-CoV-2 and other Sarbecovirus currently known to infect humans.  If clinically indicated additional testing with an alternate test  methodology (LAB74 53) is advised. The SARS-CoV-2 RNA is generally  detectable in upper and lower respiratory specimens during the acute  phase of infection. The expected result is Negative. Fact Sheet for Patients:  BoilerBrush.com.cy Fact Sheet for Healthcare Providers: https://pope.com/ This test is not yet approved or cleared by the [place] FDA and has been authorized for detection and/or diagnosis of SARS-CoV-2 by FDA under an Emergency Use Authorization (EUA).  This EUA will remain in effect (meaning this test can be used) for the duration of the COVID-19 declaration under Section 564(b)(1) of the Act, 21 U.S.C. section 360bbb-3(b)(1), unless the authorization is terminated or revoked sooner. Performed at [facility], [address] [place], [place] [address]   Blood culture (routine x 2)     Status: None (Preliminary result)   Collection Time: 01/12/19  9:53 AM   Specimen: BLOOD  Result Value Ref Range Status   Specimen Description BLOOD BLOOD RIGHT FOREARM  Final   Special Requests   Final    BOTTLES DRAWN AEROBIC AND ANAEROBIC Blood Culture results may not be optimal due to an inadequate volume of blood received in culture bottles   Culture   Final    NO GROWTH 3 DAYS Performed at [facility], [address] [place], [place] [address]    Report Status PENDING  Incomplete  Blood culture (routine x 2)     Status: None (Preliminary result)   Collection Time: 01/12/19  9:53 AM   Specimen: BLOOD  Result Value Ref Range Status   Specimen Description BLOOD LEFT ARM  Final   Special Requests   Final    BOTTLES  DRAWN AEROBIC AND ANAEROBIC Blood Culture results may not be optimal due to an inadequate volume of blood received in culture bottles   Culture   Final    NO GROWTH 3 DAYS Performed at [facility], [address], [place], [place] [address]    Report Status PENDING  Incomplete       Radiology Studies: No results found.  Scheduled Meds: . amLODipine  5 mg Oral Daily  . aspirin EC  81 mg Oral Daily  . atenolol  25 mg Oral Daily  . dexamethasone (DECADRON) injection  6 mg Intravenous Q24H  . enoxaparin (LOVENOX) injection  40 mg Subcutaneous Q24H  . febuxostat  40  mg Oral Daily  . influenza vaccine adjuvanted  0.5 mL Intramuscular Tomorrow-1000  . insulin aspart  0-5 Units Subcutaneous QHS  . insulin aspart  0-9 Units Subcutaneous TID WC  . lisinopril  10 mg Oral Daily  . polyethylene glycol  17 g Oral Daily  . sodium chloride flush  3 mL Intravenous Q12H  . vitamin C  500 mg Oral Daily  . zinc sulfate  220 mg Oral Daily   Continuous Infusions: . sodium chloride    . remdesivir 100 mg in NS 250 mL 100 mg (01/14/19 1757)     LOS: 3 days   Time spent: 72min  Katesha Eichel C Quetzali Heinle, DO [facility]  If 7PM-7AM, please contact night-coverage [email or web address] Password [email or web address] 01/15/2019, 8:12 AM

## 2019-01-15 NOTE — Plan of Care (Signed)
Husband contacted to update on patient status and current POC

## 2019-01-16 ENCOUNTER — Inpatient Hospital Stay (HOSPITAL_COMMUNITY): Payer: Medicare Other

## 2019-01-16 LAB — CBC WITH DIFFERENTIAL/PLATELET
Abs Immature Granulocytes: 0.05 10*3/uL (ref 0.00–0.07)
Basophils Absolute: 0 10*3/uL (ref 0.0–0.1)
Basophils Relative: 0 %
Eosinophils Absolute: 0 10*3/uL (ref 0.0–0.5)
Eosinophils Relative: 0 %
HCT: 39.8 % (ref 36.0–46.0)
Hemoglobin: 11.9 g/dL — ABNORMAL LOW (ref 12.0–15.0)
Immature Granulocytes: 1 %
Lymphocytes Relative: 11 %
Lymphs Abs: 0.6 10*3/uL — ABNORMAL LOW (ref 0.7–4.0)
MCH: 28.8 pg (ref 26.0–34.0)
MCHC: 29.9 g/dL — ABNORMAL LOW (ref 30.0–36.0)
MCV: 96.4 fL (ref 80.0–100.0)
Monocytes Absolute: 0.1 10*3/uL (ref 0.1–1.0)
Monocytes Relative: 2 %
Neutro Abs: 4.6 10*3/uL (ref 1.7–7.7)
Neutrophils Relative %: 86 %
Platelets: 341 10*3/uL (ref 150–400)
RBC: 4.13 MIL/uL (ref 3.87–5.11)
RDW: 13.7 % (ref 11.5–15.5)
WBC: 5.3 10*3/uL (ref 4.0–10.5)
nRBC: 0 % (ref 0.0–0.2)

## 2019-01-16 LAB — COMPREHENSIVE METABOLIC PANEL
ALT: 11 U/L (ref 0–44)
AST: 14 U/L — ABNORMAL LOW (ref 15–41)
Albumin: 2.6 g/dL — ABNORMAL LOW (ref 3.5–5.0)
Alkaline Phosphatase: 62 U/L (ref 38–126)
Anion gap: 11 (ref 5–15)
BUN: 26 mg/dL — ABNORMAL HIGH (ref 8–23)
CO2: 33 mmol/L — ABNORMAL HIGH (ref 22–32)
Calcium: 7.8 mg/dL — ABNORMAL LOW (ref 8.9–10.3)
Chloride: 93 mmol/L — ABNORMAL LOW (ref 98–111)
Creatinine, Ser: <0.3 mg/dL — ABNORMAL LOW (ref 0.44–1.00)
Glucose, Bld: 270 mg/dL — ABNORMAL HIGH (ref 70–99)
Potassium: 4.6 mmol/L (ref 3.5–5.1)
Sodium: 137 mmol/L (ref 135–145)
Total Bilirubin: 0.3 mg/dL (ref 0.3–1.2)
Total Protein: 5.2 g/dL — ABNORMAL LOW (ref 6.5–8.1)

## 2019-01-16 LAB — FERRITIN: Ferritin: 174 ng/mL (ref 11–307)

## 2019-01-16 LAB — C-REACTIVE PROTEIN: CRP: 0.8 mg/dL (ref <1.0)

## 2019-01-16 LAB — GLUCOSE, CAPILLARY
Glucose-Capillary: 284 mg/dL — ABNORMAL HIGH (ref 70–99)
Glucose-Capillary: 231 mg/dL — ABNORMAL HIGH (ref 70–99)
Glucose-Capillary: 207 mg/dL — ABNORMAL HIGH (ref 70–99)
Glucose-Capillary: 201 mg/dL — ABNORMAL HIGH (ref 70–99)
Glucose-Capillary: 124 mg/dL — ABNORMAL HIGH (ref 70–99)

## 2019-01-16 LAB — LACTATE DEHYDROGENASE: LDH: 101 U/L (ref 98–192)

## 2019-01-16 MED ORDER — LORAZEPAM 0.5 MG PO TABS
0.5000 mg | ORAL_TABLET | Freq: Once | ORAL | Status: AC
  Administered 2019-01-16: 09:00:00 0.5 mg via ORAL
  Filled 2019-01-16: qty 1

## 2019-01-16 MED ORDER — IOHEXOL 350 MG/ML SOLN
75.0000 mL | Freq: Once | INTRAVENOUS | Status: AC | PRN
  Administered 2019-01-16: 12:00:00 75 mL via INTRAVENOUS

## 2019-01-16 NOTE — Progress Notes (Signed)
Inpatient Diabetes Program Recommendations  AACE/ADA: New Consensus Statement on Inpatient Glycemic Control (2015)  Target Ranges:  Prepandial:   less than 140 mg/dL      Peak postprandial:   less than 180 mg/dL (1-2 hours)      Critically ill patients:  140 - 180 mg/dL   Lab Results  Component Value Date   GLUCAP 231 (H) 01/16/2019   HGBA1C 5.5 01/12/2019    Review of Glycemic Control Results for Priscilla Johnson, Priscilla Johnson (MRN [id number]) as of 01/16/2019 12:28  Ref. Range 01/15/2019 07:50 01/15/2019 11:55 01/15/2019 14:23 01/15/2019 16:32 01/15/2019 17:09 01/15/2019 22:20 01/16/2019 07:34  Glucose-Capillary Latest Ref Range: 70 - 99 mg/dL 210 (H) 258 (H) 269 (H) 284 (H) 256 (H) 130 (H) 231 (H)   Diabetes history: None  Current orders for Inpatient glycemic control:  Novolog 0-9 units tid Novolog 0-5 units qhs  A1c 5.5% on 9/24  Inpatient Diabetes Program Recommendations:    Glucose trends elevated in 200's. Consider Lantus 10 units.  Decadron 6 mg Daily  Thanks,  Tama Headings RN, MSN, BC-ADM Inpatient Diabetes Coordinator Team Pager [phone] (8a-5p)

## 2019-01-16 NOTE — Progress Notes (Signed)
PROGRESS NOTE    Priscilla Johnson  MCN:[id number] DOB: 11-Mar-1946 DOA: 01/12/2019 PCP: Gabriel Cirri, NP   Brief Narrative:  Priscilla Johnson is a 73 y.o. female coming from SNF via [facility], with medical history significant of DM2, HTN and chronic hypoxia (unclear etiology) on 3LNC around the clock, who presents with one week of worsening sob and cough.  No fevers.  No n/v/ but does report some diarrhea.  No abd pain or chest pain.  Referred for admit for covid pna.  Has been on po abx and steroids for last 4 days and not better. Requiring >3L [place] with exertion per ED/facility sign out.     Assessment & Plan:   Principal Problem:   Pneumonia due to COVID-19 virus Active Problems:   Severe obesity (HCC)   CKD (chronic kidney disease), stage III (HCC)   Hyperlipidemia   Essential hypertension   Chronic respiratory failure with hypoxia (HCC)   Pneumonia due to 2019-nCoV   Acute on chronic hypoxic respiratory failure in the setting of COVID-19 pneumonia, POA, resolved Concurrent likely aspiration pneumonitis ongoing -At intake patient was hypoxic to the 80s on baseline 3 L nasal cannula, now appears to be more stable on home regimen of 3 L nasal cannula at rest -Remdesivir completed - stop date 01/16/2019, Decadron per protocol -Continue supportive care, pronating as tolerated, patient poorly ambulatory at baseline, exertional hypoxia will be difficult to obtain -Unclear etiology for chronic hypoxia - does report previous work in Designer, 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 -Acute dyspnea/tachycardia in the setting of ?aspiration and likely underlying anxiety, although patient remains without hypoxia -CTA chest negative for PE; remarkable for debris in Left sided bronchus - speech to follow -Patient remains without white count procalcitonin or fever, no indication for antibiotics currently  Recent Labs    01/14/19 0243 01/15/19 0229 01/16/19 0220  FERRITIN 197 174 174   LDH 111 88* 101  CRP 2.6* 1.1* 0.8   Elevated troponin, likely supply demand mismatch  -She denies chest pain, likely in the setting of above, continue to follow clinically -EKG unremarkable  Reported history of CKD 3 -Creatinine currently within normal limits, follow with morning labs  Hypertension, essential -Well-controlled, resume home medications  Hyperlipidemia -Continue home medications  Severe obesity -Lengthy discussion at bedside about dietary and lifestyle changes  DVT prophylaxis: Lovenox Code Status: Full Family Communication: None available Disposition Plan: Inpatient -patient continues to require IV steroids and close monitoring given acute COVID-19 infection with likely concurrent aspiration pneumonitis as above.  Subjective: She continues to have intermittent episodes of tachycardia.patient indicates ongoing anxiety about discharge to nursing facility, does not want to return back to her previous facility due to what she perceives as poor management.  Denies any choking or swallowing issues spite CT findings of pre-and left bronchus.  Otherwise denies chest pain, nausea, vomiting, diarrhea, constipation, headache, fevers, chills.  Objective: Vitals:   01/16/19 0800 01/16/19 1000 01/16/19 1053 01/16/19 1100  BP: 131/80   (!) 101/55  Pulse: (!) 116     Resp: (!) 24 (!) 23 (!) 21   Temp: 98.6 F (37 C)     TempSrc: Oral     SpO2: 100%  93%   Weight:      Height:       No intake or output data in the 24 hours ending 01/16/19 1300 Filed Weights   01/12/19 1943  Weight: 68.4 kg    Examination:  General: Somewhat anxious in bed, very soft-spoken,  no acute distress. HEENT:  Normocephalic atraumatic.  Sclerae nonicteric, noninjected.  Extraocular movements intact bilaterally. Neck:  Without mass or deformity.  Trachea is midline. Lungs: Minutes breath sounds left greater than right, very poor inspiratory effort Heart: Tachycardic rate and rhythm.  Without  murmurs, rubs, or gallops. Abdomen:  Soft, nontender, nondistended.  Without guarding or rebound. Extremities: Without cyanosis, clubbing, edema, or obvious deformity. Vascular:  Dorsalis pedis and posterior tibial pulses palpable bilaterally. Skin:  Warm and dry, no erythema, no ulcerations.   Data Reviewed: I have personally reviewed following labs and imaging studies  CBC: Recent Labs  Lab 01/12/19 0953 01/13/19 0320 01/14/19 0243 01/15/19 0229 01/16/19 0220  WBC 13.7* 5.5 7.5 6.2 5.3  NEUTROABS 11.8* 4.7 6.7 5.4 4.6  HGB 13.2 11.5* 11.8* 11.9* 11.9*  HCT 41.9 37.6 39.3 38.8 39.8  MCV 92.5 95.7 96.3 96.3 96.4  PLT 437* 334 381 350 341   Basic Metabolic Panel: Recent Labs  Lab 01/12/19 2050 01/13/19 0320 01/14/19 0243 01/15/19 0229 01/16/19 0220  NA 137 136 135 136 137  K 4.2 3.5 4.1 4.0 4.6  CL 97* 96* 94* 95* 93*  CO2 28 31 34* 34* 33*  GLUCOSE 187* 237* 226* 241* 270*  BUN 28* 26*  CREATININE 0.39* 0.35* 0.34* 0.38* <0.30*  CALCIUM 8.1* 8.3* 8.2* 7.9* 7.8*   GFR: CrCl cannot be calculated (This lab value cannot be used to calculate CrCl because it is not a number: <0.30). Liver Function Tests: Recent Labs  Lab 01/12/19 2050 01/13/19 0320 01/14/19 0243 01/15/19 0229 01/16/19 0220  AST 14*  ALT ALKPHOS 56 55 61 55 62  BILITOT 0.4 0.2* 0.2* 0.1* 0.3  PROT 5.7* 5.6* 5.6* 5.1* 5.2*  ALBUMIN 2.6* 2.5* 2.7* 2.5* 2.6*   CBG: Recent Labs  Lab 01/15/19 1632 01/15/19 1709 01/15/19 2220 01/16/19 0734 01/16/19 1228  GLUCAP 284* 256* 130* 231* 207*   Sepsis Labs: Recent Labs  Lab 01/12/19 0953 01/12/19 2050  PROCALCITON  --  0.15  LATICACIDVEN 2.7* 1.2    Recent Results (from the past 240 hour(s))  SARS Coronavirus 2 [facility] order, Performed in [facility] hospital lab) Nasopharyngeal Nasopharyngeal Swab     Status: Abnormal   Collection Time: 01/12/19  9:53 AM   Specimen: Nasopharyngeal Swab  Result Value Ref  Range Status   SARS Coronavirus 2 POSITIVE (A) NEGATIVE Final    Comment: RESULT CALLED TO, READ BACK BY AND VERIFIED WITH: SARAH MCCLENDON AT 1114 ON 01/12/2019 [facility]. (NOTE) If result is NEGATIVE SARS-CoV-2 target nucleic acids are NOT DETECTED. The SARS-CoV-2 RNA is generally detectable in upper and lower  respiratory specimens during the acute phase of infection. The lowest  concentration of SARS-CoV-2 viral copies this assay can detect is 250  copies / mL. A negative result does not preclude SARS-CoV-2 infection  and should not be used as the sole basis for treatment or other  patient management decisions.  A negative result may occur with  improper specimen collection / handling, submission of specimen other  than nasopharyngeal swab, presence of viral mutation(s) within the  areas targeted by this assay, and inadequate number of viral copies  (<250 copies / mL). A negative result must be combined with clinical  observations, patient history, and epidemiological information. If result is POSITIVE SARS-CoV-2 target nucleic acids are DETECT ED. The SARS-CoV-2 RNA is generally detectable in upper and lower  respiratory specimens during  the acute phase of infection.  Positive  results are indicative of active infection with SARS-CoV-2.  Clinical  correlation with patient history and other diagnostic information is  necessary to determine patient infection status.  Positive results do  not rule out bacterial infection or co-infection with other viruses. If result is PRESUMPTIVE POSTIVE SARS-CoV-2 nucleic acids MAY BE PRESENT.   A presumptive positive result was obtained on the submitted specimen  and confirmed on repeat testing.  While 2019 novel coronavirus  (SARS-CoV-2) nucleic acids may be present in the submitted sample  additional confirmatory testing may be necessary for epidemiological  and / or clinical management purposes  to differentiate between  SARS-CoV-2 and other  Sarbecovirus currently known to infect humans.  If clinically indicated additional testing with an alternate test  methodology (LAB74 53) is advised. The SARS-CoV-2 RNA is generally  detectable in upper and lower respiratory specimens during the acute  phase of infection. The expected result is Negative. Fact Sheet for Patients:  BoilerBrush.com.cyhttps://www.fda.gov/media/136312/download Fact Sheet for Healthcare Providers: https://pope.com/https://www.fda.gov/media/136313/download This test is not yet approved or cleared by the [place] FDA and has been authorized for detection and/or diagnosis of SARS-CoV-2 by FDA under an Emergency Use Authorization (EUA).  This EUA will remain in effect (meaning this test can be used) for the duration of the COVID-19 declaration under Section 564(b)(1) of the Act, 21 U.S.C. section 360bbb-3(b)(1), unless the authorization is terminated or revoked sooner. Performed at [facility], [address] [place], [place] [address]   Blood culture (routine x 2)     Status: None (Preliminary result)   Collection Time: 01/12/19  9:53 AM   Specimen: BLOOD  Result Value Ref Range Status   Specimen Description BLOOD BLOOD RIGHT FOREARM  Final   Special Requests   Final    BOTTLES DRAWN AEROBIC AND ANAEROBIC Blood Culture results may not be optimal due to an inadequate volume of blood received in culture bottles   Culture   Final    NO GROWTH 4 DAYS Performed at [facility], [address] [place], [place] [address]    Report Status PENDING  Incomplete  Blood culture (routine x 2)     Status: None (Preliminary result)   Collection Time: 01/12/19  9:53 AM   Specimen: BLOOD  Result Value Ref Range Status   Specimen Description BLOOD LEFT ARM  Final   Special Requests   Final    BOTTLES DRAWN AEROBIC AND ANAEROBIC Blood Culture results may not be optimal due to an inadequate volume of blood received in culture bottles   Culture   Final    NO GROWTH 4  DAYS Performed at [facility], [address] [place], [place] [address]    Report Status PENDING  Incomplete       Radiology Studies: Ct Angio Chest Pe W Or Wo Contrast  Result Date: 01/16/2019 CLINICAL DATA:  Positive D-dimer EXAM: CT ANGIOGRAPHY CHEST WITH CONTRAST TECHNIQUE: Multidetector CT imaging of the chest was performed using the standard protocol during bolus administration of intravenous contrast. Multiplanar CT image reconstructions and MIPs were obtained to evaluate the vascular anatomy. CONTRAST:  75mL OMNIPAQUE IOHEXOL 350 MG/ML SOLN COMPARISON:  Chest x-ray 01/15/2019 FINDINGS: Cardiovascular: No filling defects in the pulmonary arteries to suggest pulmonary emboli. Aorta is normal caliber. Extensive coronary artery calcifications. Scattered aortic calcifications in the descending thoracic aorta. No aneurysm. Mediastinum/Nodes: No mediastinal, hilar, or axillary adenopathy. Lungs/Pleura: Small bilateral pleural effusions, left greater than right.  Airspace disease in both lower lobes, likely atelectasis in the right lung base. Airspace disease in the left base could reflect atelectasis or pneumonia. There is mucus/debris noted in the left mainstem bronchus with near complete obstruction of the left mainstem bronchus. Upper Abdomen: Imaging into the upper abdomen shows no acute findings. Musculoskeletal: Chest wall soft tissues are unremarkable. No acute bony abnormality. Review of the MIP images confirms the above findings. IMPRESSION: Mucus/debris noted within the left mainstem bronchus with near complete obstruction. Airspace disease in the left lower lobe could reflect atelectasis or pneumonia. Small bilateral pleural effusions.  Right base atelectasis. Cardiomegaly, coronary artery disease. Aortic Atherosclerosis (ICD10-I70.0). Electronically Signed   By: Rolm Baptise M.D.   On: 01/16/2019 12:08   Dg Chest Port 1 View  Result Date: 01/15/2019 CLINICAL DATA:  Hypoxia  EXAM: PORTABLE CHEST 1 VIEW COMPARISON:  01/12/2019, 10/28/2017 FINDINGS: Right lung is grossly clear. Interval consolidation of the left lung base with shift of mediastinal contents to the left. Truncated appearance of the distal left bronchus suggesting an obstruction. Enlarged cardiomediastinal silhouette. No pneumothorax. IMPRESSION: Interval development of consolidation within the left lung base with volume loss/mediastinal shift of contents to the left and abrupt truncation of the distal left bronchus suggesting central obstructing process, possibly mucous plug or a mass. Contrast-enhanced chest CT could be obtained for further evaluation. Electronically Signed   By: Donavan Foil M.D.   On: 01/15/2019 15:35    Scheduled Meds:  albuterol  2 puff Inhalation Q4H   amLODipine  5 mg Oral Daily   aspirin EC  81 mg Oral Daily   atenolol  25 mg Oral Daily   dexamethasone (DECADRON) injection  6 mg Intravenous Q24H   enoxaparin (LOVENOX) injection  40 mg Subcutaneous Q24H   febuxostat  40 mg Oral Daily   insulin aspart  0-5 Units Subcutaneous QHS   insulin aspart  0-9 Units Subcutaneous TID WC   lisinopril  10 mg Oral Daily   polyethylene glycol  17 g Oral Daily   sodium chloride flush  3 mL Intravenous Q12H   vitamin C  500 mg Oral Daily   zinc sulfate  220 mg Oral Daily   Continuous Infusions:  sodium chloride     remdesivir 100 mg in NS 250 mL 100 mg (01/15/19 1716)     LOS: 4 days   Time spent: 55min  Unknown Schleyer C Corliss Lamartina, DO [facility]  If 7PM-7AM, please contact night-coverage [email or web address] Password [email or web address] 01/16/2019, 1:00 PM

## 2019-01-16 NOTE — Evaluation (Addendum)
Clinical/Bedside Swallow Evaluation Patient Details  Name: Priscilla Johnson MRN: [id number] Date of Birth: 1945/06/23  Today's Date: 01/16/2019 Time: SLP Start Time (ACUTE ONLY): 1455 SLP Stop Time (ACUTE ONLY): 1515 SLP Time Calculation (min) (ACUTE ONLY): 20 min  Past Medical History:  Past Medical History:  Diagnosis Date  . Diabetes mellitus without complication ([place])   . Gout   . Hyperlipidemia   . Hypertension   . Insomnia   . Shoulder pain, right   . Sleep apnea    Past Surgical History:  Past Surgical History:  Procedure Laterality Date  . ABDOMINAL HYSTERECTOMY     HPI:  Pt is a 73 yo female admitted from SNF with acute on chronic respiratory failure in the setting of COVID-19 PNA with additional suspiscion for aspiration PNA. CT Chest showed debris in L bronchus. PMH includes: DM2, HTN, chronic hypoxia of unclear etiology, sleep apnea   Assessment / Plan / Recommendation Clinical Impression  Pt has no overt s/s of aspiration and denies any h/o dysphagia. She further denies any prior esophageal issues or GER. She believes she had PNA perhaps once, over a year ago. She does however describe herself as fairly bed bound, not getting OOB even for meals. She eats in bed partially reclined, and she also does not have her dentures. Mastication is effective but prolonged, especially in the setting of overall deconditioning from acute illness. Recommend adjusting diet to Dys 2 (finely chopped) solids for energy conservation and to facilitate thorough oral preparation. Will continue thin liquids for now. It will be important for pt to sit fully upright during her meals. SLP f/u is indicated given the above as well as CT findings.   SLP Visit Diagnosis: Dysphagia, unspecified (R13.10)    Aspiration Risk  Mild aspiration risk    Diet Recommendation Dysphagia 2 (Fine chop);Thin liquid   Liquid Administration via: Cup;Straw Medication Administration: Whole meds with  puree Supervision: Staff to assist with self feeding Compensations: Slow rate;Small sips/bites Postural Changes: Seated upright at 90 degrees    Other  Recommendations Oral Care Recommendations: Oral care BID   Follow up Recommendations Skilled Nursing facility      Frequency and Duration min 2x/week  2 weeks       Prognosis Prognosis for Safe Diet Advancement: Good      Swallow Study   General HPI: Pt is a 73 yo female admitted from SNF with acute on chronic respiratory failure in the setting of COVID-19 PNA with additional suspiscion for aspiration PNA. CT Chest showed debris in L bronchus. PMH includes: DM2, HTN, chronic hypoxia of unclear etiology, sleep apnea Type of Study: Bedside Swallow Evaluation Previous Swallow Assessment: none in chart Diet Prior to this Study: Regular;Thin liquids Temperature Spikes Noted: No Respiratory Status: Nasal cannula History of Recent Intubation: No Behavior/Cognition: Alert;Cooperative Oral Cavity Assessment: Within Functional Limits Oral Care Completed by SLP: No Oral Cavity - Dentition: Missing dentition;Dentures, not available Vision: Functional for self-feeding Self-Feeding Abilities: Needs assist Patient Positioning: Upright in bed Baseline Vocal Quality: Low vocal intensity Volitional Swallow: Able to elicit    Oral/Motor/Sensory Function Overall Oral Motor/Sensory Function: Generalized oral weakness   Ice Chips Ice chips: Not tested   Thin Liquid Thin Liquid: Within functional limits Presentation: Straw    Nectar Thick Nectar Thick Liquid: Not tested   Honey Thick Honey Thick Liquid: Not tested   Puree Puree: Not tested   Solid     Solid: Impaired Oral Phase Impairments: Impaired mastication  Priscilla Johnson 01/16/2019,3:39 PM  Ivar Drape, M.A. CCC-SLP Acute Herbalist [phone] Office [phone]

## 2019-01-16 NOTE — Chronic Care Management (AMB) (Signed)
°  Chronic Care Management   Outreach Note  01/16/2019 Name: Priscilla Johnson MRN: [id number] DOB: 1945-06-08  Referred by: Kathrine Haddock, NP Reason for referral : Chronic Care Management (Initial CCM outreach was unsuccessful. ), Chronic Care Management (Second CCM outreach was unsuccessful. ), and Chronic Care Management (Third CCM outreach was unsuccessful)   Third unsuccessful telephone outreach was attempted today. The patient was referred to the case management team for assistance with care management and care coordination. The patient's primary care provider has been notified of our unsuccessful attempts to make or maintain contact with the patient. The care management team is pleased to engage with this patient at any time in the future should he/she be interested in assistance from the care management team.   Follow Up Plan: The care management team is available to follow up with the patient after provider conversation with the patient regarding recommendation for care management engagement and subsequent re-referral to the care management team.   [place]  [email or web address]   ??[phone]

## 2019-01-16 NOTE — Progress Notes (Signed)
Pt became SOB and stated anxiety when RN mentioned return to SNF today. She became diaphoretic and less responsive to staff with tachycardia. MD informed ativan ordered and given. Pt improved within 30 min of ativan. Md and SW working on possible alternative care facility for pt.Hr returned to sinus with continued tachypnea. Will continue to monitor.

## 2019-01-16 NOTE — TOC Progression Note (Signed)
Transition of Care [facility] 2) - Progression Note    Patient Details  Name: Shironda Kain MRN: [id number] Date of Birth: September 27, 1945  Transition [facility]) CM/SW [employer], LCSW Phone Number: 01/16/2019, 9:44 AM  Clinical Narrative:    CSW followed up on potential discharge plans for patient. [facility] [place] will let CSW know specific arrangement's for patient's return once stable. CSW left voicemail for [facility] for long term bed availability per patient's spouse's request.    Expected Discharge Plan: [place] Barriers to Discharge: Continued Medical Work up  Expected Discharge Plan and Services Expected Discharge Plan: [place] Choice: [place] arrangements for the past 2 months: [place]                                       Social Determinants of Health (SDOH) Interventions    Readmission Risk Interventions No flowsheet data found.

## 2019-01-17 LAB — CBC WITH DIFFERENTIAL/PLATELET
Abs Immature Granulocytes: 0.07 10*3/uL (ref 0.00–0.07)
Basophils Absolute: 0 10*3/uL (ref 0.0–0.1)
Basophils Relative: 0 %
Eosinophils Absolute: 0 10*3/uL (ref 0.0–0.5)
Eosinophils Relative: 0 %
HCT: 40.8 % (ref 36.0–46.0)
Hemoglobin: 12.4 g/dL (ref 12.0–15.0)
Immature Granulocytes: 1 %
Lymphocytes Relative: 8 %
Lymphs Abs: 0.7 10*3/uL (ref 0.7–4.0)
MCH: 29.2 pg (ref 26.0–34.0)
MCHC: 30.4 g/dL (ref 30.0–36.0)
MCV: 96 fL (ref 80.0–100.0)
Monocytes Absolute: 0.2 10*3/uL (ref 0.1–1.0)
Monocytes Relative: 2 %
Neutro Abs: 7.7 10*3/uL (ref 1.7–7.7)
Neutrophils Relative %: 89 %
Platelets: 369 10*3/uL (ref 150–400)
RBC: 4.25 MIL/uL (ref 3.87–5.11)
RDW: 14.1 % (ref 11.5–15.5)
WBC: 8.7 10*3/uL (ref 4.0–10.5)
nRBC: 0 % (ref 0.0–0.2)

## 2019-01-17 LAB — CULTURE, BLOOD (ROUTINE X 2)
Culture: NO GROWTH
Culture: NO GROWTH

## 2019-01-17 LAB — COMPREHENSIVE METABOLIC PANEL
ALT: 9 U/L (ref 0–44)
AST: 15 U/L (ref 15–41)
Albumin: 2.6 g/dL — ABNORMAL LOW (ref 3.5–5.0)
Alkaline Phosphatase: 54 U/L (ref 38–126)
Anion gap: 8 (ref 5–15)
BUN: 28 mg/dL — ABNORMAL HIGH (ref 8–23)
CO2: 35 mmol/L — ABNORMAL HIGH (ref 22–32)
Calcium: 8.3 mg/dL — ABNORMAL LOW (ref 8.9–10.3)
Chloride: 94 mmol/L — ABNORMAL LOW (ref 98–111)
Creatinine, Ser: 0.34 mg/dL — ABNORMAL LOW (ref 0.44–1.00)
GFR calc Af Amer: >60 mL/min (ref >60)
GFR calc non Af Amer: >60 mL/min (ref >60)
Glucose, Bld: 193 mg/dL — ABNORMAL HIGH (ref 70–99)
Potassium: 4.8 mmol/L (ref 3.5–5.1)
Sodium: 137 mmol/L (ref 135–145)
Total Bilirubin: 0.3 mg/dL (ref 0.3–1.2)
Total Protein: 5.2 g/dL — ABNORMAL LOW (ref 6.5–8.1)

## 2019-01-17 LAB — C-REACTIVE PROTEIN: CRP: <0.8 mg/dL (ref <1.0)

## 2019-01-17 LAB — LACTATE DEHYDROGENASE: LDH: 129 U/L (ref 98–192)

## 2019-01-17 LAB — GLUCOSE, CAPILLARY
Glucose-Capillary: 225 mg/dL — ABNORMAL HIGH (ref 70–99)
Glucose-Capillary: 227 mg/dL — ABNORMAL HIGH (ref 70–99)

## 2019-01-17 LAB — FERRITIN: Ferritin: 191 ng/mL (ref 11–307)

## 2019-01-17 MED ORDER — PREDNISONE 10 MG PO TABS: ORAL_TABLET | ORAL | 0 refills | Status: AC

## 2019-01-17 MED ORDER — LORAZEPAM 0.5 MG PO TABS
1.0000 mg | ORAL_TABLET | Freq: Once | ORAL | Status: AC
  Administered 2019-01-17: 13:00:00 1 mg via ORAL
  Filled 2019-01-17: qty 2

## 2019-01-17 MED ORDER — HYDROCODONE-ACETAMINOPHEN 5-325 MG PO TABS: 1.0000 | ORAL_TABLET | Freq: Four times a day (QID) | ORAL | 0 refills | Status: AC | PRN

## 2019-01-17 NOTE — Plan of Care (Signed)

## 2019-01-17 NOTE — Discharge Summary (Signed)
Physician Discharge Summary  Priscilla Johnson PPI:[id number] DOB: 1945-04-28 DOA: 01/12/2019  PCP: Kathrine Haddock, NP  Admit date: 01/12/2019 Discharge date: 01/17/2019  [facility] Disposition: [facility]  Recommendations for Outpatient Follow-up:  1. Follow up with PCP in 1-2 weeks 2. Please obtain BMP/CBC in one week  Discharge Condition: Stable CODE STATUS: Full Diet recommendation: Chopped with thin liquids, dysphagia 2  Brief/Interim Summary: Priscilla Websteris a 73 y.o.female coming from SNF via [place] history significant ofDM2, HTN and chronic hypoxia (unclear etiology) on 3LNC around the clock, who presents with one week of worsening sob and cough. No fevers. No n/v/ but does report some diarrhea. No abd pain or chest pain. Referred for admit for covid pna. Has been on po abx and steroids for last 4 days and not better. Requiring >3L [place] with exertion per ED/facility sign out.  Patient admitted to [facility] for acute on chronic hypoxic respiratory failure in the setting of COVID-19 pneumonia.  Patient symptoms have now resolved, patient has completed Remdesivir course, continue steroids.  Patient is bedbound per report, she indicates she has not walked in over a year.  Patient had CT chest given ongoing episodes of tachycardia and tachypnea although without hypoxia.  CT does show some atelectasis and likely debris in left bronchus concerning for aspiration, speech evaluated, patient placed on dysphagia 2 diet with chopped foods and thin liquids.  Concerning history of anxiety given whenever discussion of disposition back to [facility] facility as outpatient becomes quite overtly anxious, diaphoretic, tachypneic, tachycardic.  Unclear anxiety history per patient or family however patient has improved quite well during her events on very low-dose 0.5 mg of Ativan.  Certainly would not continue this in the outpatient setting given her advanced age but  patient would benefit from evaluation for long-term anxiety medications that are not benzodiazepines.  We discussed at length with patient and family if she is truly unhappy at her facility she should be able to change facilities if 1 can be available given her COVID-19 pneumonia diagnosis recently.  At this time patient is stable for discharge back to SNF, defer to case management for discharge back to [place] versus other facility per their discussion.  Discharge Diagnoses:  Principal Problem:   Pneumonia due to COVID-19 virus Active Problems:   Severe obesity ([place])   CKD (chronic kidney disease), stage III (HCC)   Hyperlipidemia   Essential hypertension   Chronic respiratory failure with hypoxia (HCC)   Pneumonia due to 2019-nCoV    Discharge Instructions  Discharge Instructions    Call MD for:  difficulty breathing, headache or visual disturbances   Complete by: As directed    Call MD for:  extreme fatigue   Complete by: As directed    Call MD for:  hives   Complete by: As directed    Call MD for:  persistant dizziness or light-headedness   Complete by: As directed    Call MD for:  persistant nausea and vomiting   Complete by: As directed    Call MD for:  severe uncontrolled pain   Complete by: As directed    Call MD for:  temperature >100.4   Complete by: As directed    Increase activity slowly   Complete by: As directed      Allergies as of 01/17/2019   No Known Allergies     Medication List    STOP taking these medications   levofloxacin 750 MG tablet Commonly known as:  LEVAQUIN   traZODone 50 MG tablet Commonly known as: DESYREL     TAKE these medications   acetaminophen 325 MG tablet Commonly known as: TYLENOL Take 650 mg by mouth every 4 (four) hours as needed for mild pain.   amLODipine 5 MG tablet Commonly known as: NORVASC Take 1 tablet (5 mg total) by mouth daily.   atenolol 25 MG tablet Commonly known as: TENORMIN Take 25 mg  by mouth daily.   Cranberry 450 MG Caps Take 900 mg by mouth 2 (two) times daily.   febuxostat 40 MG tablet Commonly known as: ULORIC Take 40 mg by mouth daily.   fluocinonide ointment 0.05 % Commonly known as: LIDEX Apply 1 application topically as needed (dermatitis).   HYDROcodone-acetaminophen 5-325 MG tablet Commonly known as: NORCO/VICODIN Take 1 tablet by mouth every 6 (six) hours as needed for moderate pain.   lisinopril 10 MG tablet Commonly known as: ZESTRIL Take 10 mg by mouth daily.   MELATIN PO Take 1 tablet by mouth at bedtime.   metFORMIN 500 MG tablet Commonly known as: GLUCOPHAGE TAKE 1 TABLET BY MOUTH TWO  TIMES DAILY What changed: when to take this   OXYGEN Inhale 2 L into the lungs as needed (shortness of breath).   polyethylene glycol 17 g packet Commonly known as: MIRALAX / GLYCOLAX Take 17 g by mouth daily.   predniSONE 10 MG tablet Commonly known as: DELTASONE Take 2 tablets (20 mg total) by mouth daily for 3 days, THEN 1 tablet (10 mg total) daily for 3 days. Start taking on: January 17, 2019       No Known Allergies  Procedures/Studies: Ct Angio Chest Pe W Or Wo Contrast  Result Date: 01/16/2019 CLINICAL DATA:  Positive D-dimer EXAM: CT ANGIOGRAPHY CHEST WITH CONTRAST TECHNIQUE: Multidetector CT imaging of the chest was performed using the standard protocol during bolus administration of intravenous contrast. Multiplanar CT image reconstructions and MIPs were obtained to evaluate the vascular anatomy. CONTRAST:  62mL OMNIPAQUE IOHEXOL 350 MG/ML SOLN COMPARISON:  Chest x-ray 01/15/2019 FINDINGS: Cardiovascular: No filling defects in the pulmonary arteries to suggest pulmonary emboli. Aorta is normal caliber. Extensive coronary artery calcifications. Scattered aortic calcifications in the descending thoracic aorta. No aneurysm. Mediastinum/Nodes: No mediastinal, hilar, or axillary adenopathy. Lungs/Pleura: Small bilateral pleural effusions,  left greater than right. Airspace disease in both lower lobes, likely atelectasis in the right lung base. Airspace disease in the left base could reflect atelectasis or pneumonia. There is mucus/debris noted in the left mainstem bronchus with near complete obstruction of the left mainstem bronchus. Upper Abdomen: Imaging into the upper abdomen shows no acute findings. Musculoskeletal: Chest wall soft tissues are unremarkable. No acute bony abnormality. Review of the MIP images confirms the above findings. IMPRESSION: Mucus/debris noted within the left mainstem bronchus with near complete obstruction. Airspace disease in the left lower lobe could reflect atelectasis or pneumonia. Small bilateral pleural effusions.  Right base atelectasis. Cardiomegaly, coronary artery disease. Aortic Atherosclerosis (ICD10-I70.0). Electronically Signed   By: Charlett Nose M.D.   On: 01/16/2019 12:08   Dg Chest Port 1 View  Result Date: 01/15/2019 CLINICAL DATA:  Hypoxia EXAM: PORTABLE CHEST 1 VIEW COMPARISON:  01/12/2019, 10/28/2017 FINDINGS: Right lung is grossly clear. Interval consolidation of the left lung base with shift of mediastinal contents to the left. Truncated appearance of the distal left bronchus suggesting an obstruction. Enlarged cardiomediastinal silhouette. No pneumothorax. IMPRESSION: Interval development of consolidation within the left lung base with volume loss/mediastinal shift  of contents to the left and abrupt truncation of the distal left bronchus suggesting central obstructing process, possibly mucous plug or a mass. Contrast-enhanced chest CT could be obtained for further evaluation. Electronically Signed   By: Jasmine Pang M.D.   On: 01/15/2019 15:35   Dg Chest Portable 1 View  Result Date: 01/12/2019 CLINICAL DATA:  Shortness of breath EXAM: PORTABLE CHEST 1 VIEW COMPARISON:  10/28/2017 FINDINGS: The heart size and mediastinal contours are stable. Medial right lung base opacity. No pleural  effusion. No pneumothorax. The visualized skeletal structures are unremarkable. IMPRESSION: Medial right lung base opacity may reflect atelectasis versus pneumonia. Electronically Signed   By: Duanne Guess M.D.   On: 01/12/2019 10:18    Subjective: No acute issues or events overnight, tolerating home oxygen well, while evaluation unremarkable, otherwise stable for discharge as above.  Denies fevers, chills, nausea, vomiting, diarrhea, constipation, headache, shortness of breath, chest pain.   Discharge Exam: Vitals:   01/17/19 0716 01/17/19 0718  BP: (!) 120/58   Pulse: 89   Resp: (!) 28 18  Temp: 98 F (36.7 C)   SpO2: 97%    Vitals:   01/17/19 0053 01/17/19 0333 01/17/19 0716 01/17/19 0718  BP: 92/62 (!) 104/53 (!) 120/58   Pulse:  81 89   Resp:  (!) 28 (!) 28 18  Temp:  97.9 F (36.6 C) 98 F (36.7 C)   TempSrc:  Oral Oral   SpO2:  100% 97%   Weight:      Height:        General:  Pleasantly resting in bed, No acute distress. HEENT:  Normocephalic atraumatic.  Sclerae nonicteric, noninjected.  Extraocular movements intact bilaterally. Neck:  Without mass or deformity.  Trachea is midline. Lungs:  Clear to auscultate bilaterally without rhonchi, wheeze, or rales. Heart:  Regular rate and rhythm.  Without murmurs, rubs, or gallops. Abdomen:  Soft, nontender, nondistended.  Without guarding or rebound. Extremities: Without cyanosis, clubbing, edema, or obvious deformity. Vascular:  Dorsalis pedis and posterior tibial pulses palpable bilaterally. Skin:  Warm and dry, no erythema, no ulcerations.   The results of significant diagnostics from this hospitalization (including imaging, microbiology, ancillary and laboratory) are listed below for reference.     Microbiology: Recent Results (from the past 240 hour(s))  SARS Coronavirus 2 [facility] order, Performed in [facility] hospital lab) Nasopharyngeal Nasopharyngeal Swab     Status: Abnormal   Collection Time: 01/12/19   9:53 AM   Specimen: Nasopharyngeal Swab  Result Value Ref Range Status   SARS Coronavirus 2 POSITIVE (A) NEGATIVE Final    Comment: RESULT CALLED TO, READ BACK BY AND VERIFIED WITH: SARAH MCCLENDON AT 1114 ON 01/12/2019 [facility]. (NOTE) If result is NEGATIVE SARS-CoV-2 target nucleic acids are NOT DETECTED. The SARS-CoV-2 RNA is generally detectable in upper and lower  respiratory specimens during the acute phase of infection. The lowest  concentration of SARS-CoV-2 viral copies this assay can detect is 250  copies / mL. A negative result does not preclude SARS-CoV-2 infection  and should not be used as the sole basis for treatment or other  patient management decisions.  A negative result may occur with  improper specimen collection / handling, submission of specimen other  than nasopharyngeal swab, presence of viral mutation(s) within the  areas targeted by this assay, and inadequate number of viral copies  (<250 copies / mL). A negative result must be combined with clinical  observations, patient history, and epidemiological information. If result  is POSITIVE SARS-CoV-2 target nucleic acids are DETECT ED. The SARS-CoV-2 RNA is generally detectable in upper and lower  respiratory specimens during the acute phase of infection.  Positive  results are indicative of active infection with SARS-CoV-2.  Clinical  correlation with patient history and other diagnostic information is  necessary to determine patient infection status.  Positive results do  not rule out bacterial infection or co-infection with other viruses. If result is PRESUMPTIVE POSTIVE SARS-CoV-2 nucleic acids MAY BE PRESENT.   A presumptive positive result was obtained on the submitted specimen  and confirmed on repeat testing.  While 2019 novel coronavirus  (SARS-CoV-2) nucleic acids may be present in the submitted sample  additional confirmatory testing may be necessary for epidemiological  and / or clinical management  purposes  to differentiate between  SARS-CoV-2 and other Sarbecovirus currently known to infect humans.  If clinically indicated additional testing with an alternate test  methodology (LAB74 53) is advised. The SARS-CoV-2 RNA is generally  detectable in upper and lower respiratory specimens during the acute  phase of infection. The expected result is Negative. Fact Sheet for Patients:  BoilerBrush.com.cy Fact Sheet for Healthcare Providers: https://pope.com/ This test is not yet approved or cleared by the [place] FDA and has been authorized for detection and/or diagnosis of SARS-CoV-2 by FDA under an Emergency Use Authorization (EUA).  This EUA will remain in effect (meaning this test can be used) for the duration of the COVID-19 declaration under Section 564(b)(1) of the Act, 21 U.S.C. section 360bbb-3(b)(1), unless the authorization is terminated or revoked sooner. Performed at [facility], [address] [place], [place] [address]   Blood culture (routine x 2)     Status: None   Collection Time: 01/12/19  9:53 AM   Specimen: BLOOD  Result Value Ref Range Status   Specimen Description BLOOD BLOOD RIGHT FOREARM  Final   Special Requests   Final    BOTTLES DRAWN AEROBIC AND ANAEROBIC Blood Culture results may not be optimal due to an inadequate volume of blood received in culture bottles   Culture   Final    NO GROWTH 5 DAYS Performed at [facility], [address] [place], [place] [address]    Report Status 01/17/2019 FINAL  Final  Blood culture (routine x 2)     Status: None   Collection Time: 01/12/19  9:53 AM   Specimen: BLOOD  Result Value Ref Range Status   Specimen Description BLOOD LEFT ARM  Final   Special Requests   Final    BOTTLES DRAWN AEROBIC AND ANAEROBIC Blood Culture results may not be optimal due to an inadequate volume of blood received in culture bottles   Culture   Final    NO  GROWTH 5 DAYS Performed at [facility], [address] [place], [place] [address]    Report Status 01/17/2019 FINAL  Final     Labs:  Basic Metabolic Panel: Recent Labs  Lab 01/13/19 0320 01/14/19 0243 01/15/19 0229 01/16/19 0220 01/17/19 0220  NA 136 135 136 137 137  K 3.5 4.1 4.0 4.6 4.8  CL 96* 94* 95* 93* 94*  CO2 31 34* 34* 33* 35*  GLUCOSE 237* 226* 241* 270* 193*  BUN 16 21 28* 26* 28*  CREATININE 0.35* 0.34* 0.38* <0.30* 0.34*  CALCIUM 8.3* 8.2* 7.9* 7.8* 8.3*   Liver Function Tests: Recent Labs  Lab 01/13/19 0320 01/14/19 0243 01/15/19 0229 01/16/19 0220 01/17/19 0220  AST 17 19  16 14* 15  ALT ALKPHOS 55 61 55 62 54  BILITOT 0.2* 0.2* 0.1* 0.3 0.3  PROT 5.6* 5.6* 5.1* 5.2* 5.2*  ALBUMIN 2.5* 2.7* 2.5* 2.6* 2.6*   CBC: Recent Labs  Lab 01/13/19 0320 01/14/19 0243 01/15/19 0229 01/16/19 0220 01/17/19 0220  WBC 5.5 7.5 6.2 5.3 8.7  NEUTROABS 4.7 6.7 5.4 4.6 7.7  HGB 11.5* 11.8* 11.9* 11.9* 12.4  HCT 37.6 39.3 38.8 39.8 40.8  MCV 95.7 96.3 96.3 96.4 96.0  PLT 334 381 350 341 369   CBG: Recent Labs  Lab 01/16/19 0734 01/16/19 1228 01/16/19 1604 01/16/19 2306 01/17/19 0735  GLUCAP 231* 207* 201* 124* 225*   Anemia work up Recent Labs    01/15/19 0229 01/16/19 0220  FERRITIN 174 174   Urinalysis    Component Value Date/Time   COLORURINE AMBER (A) 10/28/2017 1832   APPEARANCEUR CLOUDY (A) 10/28/2017 1832   LABSPEC 1.029 10/28/2017 1832   PHURINE 5.0 10/28/2017 1832   GLUCOSEU NEGATIVE 10/28/2017 1832   HGBUR SMALL (A) 10/28/2017 1832   BILIRUBINUR NEGATIVE 10/28/2017 1832   KETONESUR NEGATIVE 10/28/2017 1832   PROTEINUR 30 (A) 10/28/2017 1832   NITRITE POSITIVE (A) 10/28/2017 1832   LEUKOCYTESUR MODERATE (A) 10/28/2017 1832   Sepsis Labs Invalid input(s): PROCALCITONIN,  WBC,  LACTICIDVEN Microbiology Recent Results (from the past 240 hour(s))  SARS Coronavirus 2 [facility] order, Performed in [facility] hospital lab) Nasopharyngeal Nasopharyngeal Swab     Status: Abnormal   Collection Time: 01/12/19  9:53 AM   Specimen: Nasopharyngeal Swab  Result Value Ref Range Status   SARS Coronavirus 2 POSITIVE (A) NEGATIVE Final    Comment: RESULT CALLED TO, READ BACK BY AND VERIFIED WITH: SARAH MCCLENDON AT 1114 ON 01/12/2019 [facility]. (NOTE) If result is NEGATIVE SARS-CoV-2 target nucleic acids are NOT DETECTED. The SARS-CoV-2 RNA is generally detectable in upper and lower  respiratory specimens during the acute phase of infection. The lowest  concentration of SARS-CoV-2 viral copies this assay can detect is 250  copies / mL. A negative result does not preclude SARS-CoV-2 infection  and should not be used as the sole basis for treatment or other  patient management decisions.  A negative result may occur with  improper specimen collection / handling, submission of specimen other  than nasopharyngeal swab, presence of viral mutation(s) within the  areas targeted by this assay, and inadequate number of viral copies  (<250 copies / mL). A negative result must be combined with clinical  observations, patient history, and epidemiological information. If result is POSITIVE SARS-CoV-2 target nucleic acids are DETECT ED. The SARS-CoV-2 RNA is generally detectable in upper and lower  respiratory specimens during the acute phase of infection.  Positive  results are indicative of active infection with SARS-CoV-2.  Clinical  correlation with patient history and other diagnostic information is  necessary to determine patient infection status.  Positive results do  not rule out bacterial infection or co-infection with other viruses. If result is PRESUMPTIVE POSTIVE SARS-CoV-2 nucleic acids MAY BE PRESENT.   A presumptive positive result was obtained on the submitted specimen  and confirmed on repeat testing.  While 2019 novel coronavirus  (SARS-CoV-2) nucleic acids may be present in the submitted sample   additional confirmatory testing may be necessary for epidemiological  and / or clinical management purposes  to differentiate between  SARS-CoV-2 and other Sarbecovirus currently known to infect humans.  If clinically indicated additional testing  with an alternate test  methodology (LAB74 53) is advised. The SARS-CoV-2 RNA is generally  detectable in upper and lower respiratory specimens during the acute  phase of infection. The expected result is Negative. Fact Sheet for Patients:  https://www.fda.gov/media/136312/download Fact Sheet for Healthcare Providers: https://pope.com/https://www.fda.gov/media/136313/download This test is not yet approved or cleared by the [place] FDA and has beeBoilerBrush.com.cyn authorized for detection and/or diagnosis of SARS-CoV-2 by FDA under an Emergency Use Authorization (EUA).  This EUA will remain in effect (meaning this test can be used) for the duration of the COVID-19 declaration under Section 564(b)(1) of the Act, 21 U.S.C. section 360bbb-3(b)(1), unless the authorization is terminated or revoked sooner. Performed at [facility], [address] [place], [place] [address]   Blood culture (routine x 2)     Status: None   Collection Time: 01/12/19  9:53 AM   Specimen: BLOOD  Result Value Ref Range Status   Specimen Description BLOOD BLOOD RIGHT FOREARM  Final   Special Requests   Final    BOTTLES DRAWN AEROBIC AND ANAEROBIC Blood Culture results may not be optimal due to an inadequate volume of blood received in culture bottles   Culture   Final    NO GROWTH 5 DAYS Performed at [facility], [address] [place], [place] [address]    Report Status 01/17/2019 FINAL  Final  Blood culture (routine x 2)     Status: None   Collection Time: 01/12/19  9:53 AM   Specimen: BLOOD  Result Value Ref Range Status   Specimen Description BLOOD LEFT ARM  Final   Special Requests   Final    BOTTLES DRAWN AEROBIC AND ANAEROBIC Blood Culture results may not be  optimal due to an inadequate volume of blood received in culture bottles   Culture   Final    NO GROWTH 5 DAYS Performed at [facility], [address] [place], [place] [address]    Report Status 01/17/2019 FINAL  Final     Time coordinating discharge: Over 30 minutes  SIGNED:   Azucena FallenWilliam C Alvera Tourigny, DO [facility] 01/17/2019, 8:09 AM Pager   If 7PM-7AM, please contact night-coverage [email or web address] Password [email or web address]

## 2019-01-17 NOTE — TOC Progression Note (Signed)
Transition of Care [facility]) - Progression Note    Patient Details  Name: Samreet Edenfield MRN: [id number] Date of Birth: 1945-09-19  Transition of [facility]) CM/SW [place], LCSW Phone Number: 01/17/2019, 8:24 AM  Clinical Narrative:    CSW sent DC summary to [place] for review. They will let CSW know when to call transport. CSW spoke with patient's spouse and explained that a SNF search did not yield any offers. [facility] unable to accept patient until she tests negative for COVID and [facility] not able to accept patient. Patient's spouse very pleasant and stated that [employer] has taken care of patient more than he has been able to and that he believes patient will be fine there. He reported appreciation for the care the hospital has provided.    Expected Discharge Plan: [place] Barriers to Discharge: No Barriers Identified  Expected Discharge Plan and Services Expected Discharge Plan: [place] Choice: [place] Living arrangements for the past 2 months: [place] Expected Discharge Date: 01/17/19               DME Arranged: N/A DME Agency: NA       HH Arranged: NA HH Agency: NA         Social Determinants of Health (SDOH) Interventions    Readmission Risk Interventions No flowsheet data found.

## 2019-01-17 NOTE — TOC Transition Note (Signed)
Transition of Care [facility]) - CM/SW Discharge Note   Patient Details  Name: Syanne Looney MRN: [id number] Date of Birth: 1945-11-28  Transition of Care [facility]) CM/SW Contact:  Benard [place]sted, LCSW Phone Number: 01/17/2019, 12:22 PM   Clinical Narrative:    Patient will DC to: [facility] Anticipated DC date: 01/17/19 Family notified: [name], spouse Transport by: Glenna Fellows   Per MD patient ready for DC to [facility]. RN, patient, patient's family, and facility notified of DC. Discharge Summary and FL2 sent to facility. RN to call report prior to discharge ([phone] Room 29B). DC packet printed on unit printer. Please make sure signed Norco script goes with patient. Ambulance transport requested for patient.   CSW will sign off for now as social work intervention is no longer needed. Please consult [place] again if new needs arise.  Cedric Fishman, LCSW Clinical Social Worker [phone]    Final next level of care: Skilled Nursing Facility Barriers to Discharge: No Barriers Identified   Patient Goals and CMS Choice   CMS Medicare.gov Compare Post Acute Care list provided to:: Patient Represenative (must comment)(husband [name]) Choice offered to / list presented to : Spouse  Discharge Placement   Existing PASRR number confirmed : 01/17/19          Patient [facility] at[facility] Patient to be transferred to facility by: [place] Name of family member notified: Spouse Patient and family notified of of transfer: 01/17/19  Discharge Plan and Services     Post Acute Care Choice: [place]          DME Arranged: N/A DME Agency: NA       HH Arranged: NA HH Agency: NA        Social Determinants of Health (SDOH) Interventions     Readmission Risk Interventions No flowsheet data found.

## 2019-01-17 NOTE — Plan of Care (Signed)
Pt has been resting comfortably in bed with O2 at 3L.  NAD noted.  A&O.  Pt has been slightly hypotensive, but other VS have been stable.  No complaints or concerns voiced.   No significant events have occurred thus far.  Will continue to monitor pt.   Problem: Education: Goal: Knowledge of risk factors and measures for prevention of condition will improve Outcome: Progressing   Problem: Coping: Goal: Psychosocial and spiritual needs will be supported Outcome: Progressing   Problem: Respiratory: Goal: Will maintain a patent airway Outcome: Progressing Goal: Complications related to the disease process, condition or treatment will be avoided or minimized Outcome: Progressing   Problem: Education: Goal: Knowledge of General Education information will improve Description: Including pain rating scale, medication(s)/side effects and non-pharmacologic comfort measures Outcome: Progressing   Problem: Health Behavior/Discharge Planning: Goal: Ability to manage health-related needs will improve Outcome: Progressing   Problem: Clinical Measurements: Goal: Ability to maintain clinical measurements within normal limits will improve Outcome: Progressing Goal: Will remain free from infection Outcome: Progressing Goal: Diagnostic test results will improve Outcome: Progressing Goal: Respiratory complications will improve Outcome: Progressing Goal: Cardiovascular complication will be avoided Outcome: Progressing   Problem: Activity: Goal: Risk for activity intolerance will decrease Outcome: Progressing   Problem: Nutrition: Goal: Adequate nutrition will be maintained Outcome: Progressing   Problem: Coping: Goal: Level of anxiety will decrease Outcome: Progressing   Problem: Elimination: Goal: Will not experience complications related to bowel motility Outcome: Progressing Goal: Will not experience complications related to urinary retention Outcome: Progressing   Problem: Pain  Managment: Goal: General experience of comfort will improve Outcome: Progressing   Problem: Safety: Goal: Ability to remain free from injury will improve Outcome: Progressing   Problem: Skin Integrity: Goal: Risk for impaired skin integrity will decrease Outcome: Progressing

## 2019-01-18 ENCOUNTER — Inpatient Hospital Stay (HOSPITAL_COMMUNITY)
Admission: EM | Admit: 2019-01-18 | Discharge: 2019-02-19 | DRG: 871 | Disposition: E | Payer: Medicare Other | Source: Other Acute Inpatient Hospital | Attending: Internal Medicine | Admitting: Internal Medicine

## 2019-01-18 ENCOUNTER — Encounter (HOSPITAL_COMMUNITY): Payer: Self-pay

## 2019-01-18 ENCOUNTER — Inpatient Hospital Stay (HOSPITAL_COMMUNITY): Payer: Medicare Other

## 2019-01-18 ENCOUNTER — Encounter: Payer: Self-pay | Admitting: *Deleted

## 2019-01-18 ENCOUNTER — Emergency Department
Admission: EM | Admit: 2019-01-18 | Discharge: 2019-01-18 | Disposition: A | Discharge status: Other Acute Inpatient Hospital | Payer: Medicare Other | Attending: Emergency Medicine | Admitting: Emergency Medicine

## 2019-01-18 ENCOUNTER — Other Ambulatory Visit: Payer: Self-pay

## 2019-01-18 ENCOUNTER — Emergency Department: Payer: Medicare Other

## 2019-01-18 DIAGNOSIS — G4733 Obstructive sleep apnea (adult) (pediatric): Secondary | ICD-10-CM | POA: Diagnosis present

## 2019-01-18 DIAGNOSIS — A4189 Other specified sepsis: Secondary | ICD-10-CM | POA: Diagnosis not present

## 2019-01-18 DIAGNOSIS — Z515 Encounter for palliative care: Secondary | ICD-10-CM | POA: Diagnosis not present

## 2019-01-18 DIAGNOSIS — E118 Type 2 diabetes mellitus with unspecified complications: Secondary | ICD-10-CM | POA: Diagnosis present

## 2019-01-18 DIAGNOSIS — J159 Unspecified bacterial pneumonia: Secondary | ICD-10-CM | POA: Diagnosis present

## 2019-01-18 DIAGNOSIS — N390 Urinary tract infection, site not specified: Secondary | ICD-10-CM | POA: Diagnosis not present

## 2019-01-18 DIAGNOSIS — E785 Hyperlipidemia, unspecified: Secondary | ICD-10-CM | POA: Diagnosis present

## 2019-01-18 DIAGNOSIS — I248 Other forms of acute ischemic heart disease: Secondary | ICD-10-CM | POA: Diagnosis present

## 2019-01-18 DIAGNOSIS — R579 Shock, unspecified: Secondary | ICD-10-CM | POA: Diagnosis not present

## 2019-01-18 DIAGNOSIS — R231 Pallor: Secondary | ICD-10-CM | POA: Diagnosis not present

## 2019-01-18 DIAGNOSIS — J9621 Acute and chronic respiratory failure with hypoxia: Secondary | ICD-10-CM

## 2019-01-18 DIAGNOSIS — Z833 Family history of diabetes mellitus: Secondary | ICD-10-CM | POA: Diagnosis not present

## 2019-01-18 DIAGNOSIS — R0689 Other abnormalities of breathing: Secondary | ICD-10-CM | POA: Diagnosis not present

## 2019-01-18 DIAGNOSIS — J9611 Chronic respiratory failure with hypoxia: Secondary | ICD-10-CM | POA: Diagnosis present

## 2019-01-18 DIAGNOSIS — T68XXXA Hypothermia, initial encounter: Secondary | ICD-10-CM

## 2019-01-18 DIAGNOSIS — A419 Sepsis, unspecified organism: Secondary | ICD-10-CM

## 2019-01-18 DIAGNOSIS — E46 Unspecified protein-calorie malnutrition: Secondary | ICD-10-CM | POA: Diagnosis not present

## 2019-01-18 DIAGNOSIS — I959 Hypotension, unspecified: Secondary | ICD-10-CM | POA: Insufficient documentation

## 2019-01-18 DIAGNOSIS — R570 Cardiogenic shock: Secondary | ICD-10-CM | POA: Diagnosis present

## 2019-01-18 DIAGNOSIS — I10 Essential (primary) hypertension: Secondary | ICD-10-CM | POA: Diagnosis not present

## 2019-01-18 DIAGNOSIS — Z6835 Body mass index (BMI) 35.0-35.9, adult: Secondary | ICD-10-CM | POA: Diagnosis not present

## 2019-01-18 DIAGNOSIS — R404 Transient alteration of awareness: Secondary | ICD-10-CM | POA: Diagnosis not present

## 2019-01-18 DIAGNOSIS — Y95 Nosocomial condition: Secondary | ICD-10-CM | POA: Diagnosis present

## 2019-01-18 DIAGNOSIS — G4730 Sleep apnea, unspecified: Secondary | ICD-10-CM | POA: Diagnosis not present

## 2019-01-18 DIAGNOSIS — E1122 Type 2 diabetes mellitus with diabetic chronic kidney disease: Secondary | ICD-10-CM | POA: Insufficient documentation

## 2019-01-18 DIAGNOSIS — Z823 Family history of stroke: Secondary | ICD-10-CM | POA: Diagnosis not present

## 2019-01-18 DIAGNOSIS — J1289 Other viral pneumonia: Secondary | ICD-10-CM | POA: Diagnosis present

## 2019-01-18 DIAGNOSIS — J90 Pleural effusion, not elsewhere classified: Secondary | ICD-10-CM | POA: Diagnosis not present

## 2019-01-18 DIAGNOSIS — N183 Chronic kidney disease, stage 3 unspecified: Secondary | ICD-10-CM | POA: Diagnosis not present

## 2019-01-18 DIAGNOSIS — F419 Anxiety disorder, unspecified: Secondary | ICD-10-CM | POA: Diagnosis present

## 2019-01-18 DIAGNOSIS — J9600 Acute respiratory failure, unspecified whether with hypoxia or hypercapnia: Secondary | ICD-10-CM | POA: Diagnosis present

## 2019-01-18 DIAGNOSIS — U071 COVID-19: Secondary | ICD-10-CM | POA: Diagnosis not present

## 2019-01-18 DIAGNOSIS — R6520 Severe sepsis without septic shock: Secondary | ICD-10-CM | POA: Diagnosis not present

## 2019-01-18 DIAGNOSIS — R6521 Severe sepsis with septic shock: Secondary | ICD-10-CM | POA: Diagnosis present

## 2019-01-18 DIAGNOSIS — E1165 Type 2 diabetes mellitus with hyperglycemia: Secondary | ICD-10-CM | POA: Diagnosis not present

## 2019-01-18 DIAGNOSIS — I129 Hypertensive chronic kidney disease with stage 1 through stage 4 chronic kidney disease, or unspecified chronic kidney disease: Secondary | ICD-10-CM | POA: Insufficient documentation

## 2019-01-18 DIAGNOSIS — Z90710 Acquired absence of both cervix and uterus: Secondary | ICD-10-CM

## 2019-01-18 DIAGNOSIS — Z9981 Dependence on supplemental oxygen: Secondary | ICD-10-CM

## 2019-01-18 DIAGNOSIS — Z79890 Hormone replacement therapy (postmenopausal): Secondary | ICD-10-CM

## 2019-01-18 DIAGNOSIS — R680 Hypothermia, not associated with low environmental temperature: Secondary | ICD-10-CM | POA: Insufficient documentation

## 2019-01-18 DIAGNOSIS — M109 Gout, unspecified: Secondary | ICD-10-CM | POA: Diagnosis not present

## 2019-01-18 DIAGNOSIS — Z7984 Long term (current) use of oral hypoglycemic drugs: Secondary | ICD-10-CM | POA: Insufficient documentation

## 2019-01-18 DIAGNOSIS — E7800 Pure hypercholesterolemia, unspecified: Secondary | ICD-10-CM | POA: Diagnosis not present

## 2019-01-18 DIAGNOSIS — J189 Pneumonia, unspecified organism: Secondary | ICD-10-CM

## 2019-01-18 DIAGNOSIS — Z79891 Long term (current) use of opiate analgesic: Secondary | ICD-10-CM

## 2019-01-18 DIAGNOSIS — Z66 Do not resuscitate: Secondary | ICD-10-CM | POA: Diagnosis present

## 2019-01-18 DIAGNOSIS — Z7401 Bed confinement status: Secondary | ICD-10-CM

## 2019-01-18 DIAGNOSIS — Z79899 Other long term (current) drug therapy: Secondary | ICD-10-CM

## 2019-01-18 HISTORY — DX: COVID-19: U07.1

## 2019-01-18 LAB — COMPREHENSIVE METABOLIC PANEL
ALT: 7 U/L (ref 0–44)
ALT: 9 U/L (ref 0–44)
AST: 15 U/L (ref 15–41)
AST: 20 U/L (ref 15–41)
Albumin: 1.9 g/dL — ABNORMAL LOW (ref 3.5–5.0)
Albumin: 2.4 g/dL — ABNORMAL LOW (ref 3.5–5.0)
Alkaline Phosphatase: 42 U/L (ref 38–126)
Alkaline Phosphatase: 58 U/L (ref 38–126)
Anion gap: 11 (ref 5–15)
Anion gap: 9 (ref 5–15)
BUN: 24 mg/dL — ABNORMAL HIGH (ref 8–23)
BUN: 22 mg/dL (ref 8–23)
CO2: 32 mmol/L (ref 22–32)
CO2: 27 mmol/L (ref 22–32)
Calcium: 6.9 mg/dL — ABNORMAL LOW (ref 8.9–10.3)
Calcium: 7.3 mg/dL — ABNORMAL LOW (ref 8.9–10.3)
Chloride: 98 mmol/L (ref 98–111)
Chloride: 103 mmol/L (ref 98–111)
Creatinine, Ser: <0.3 mg/dL — ABNORMAL LOW (ref 0.44–1.00)
Creatinine, Ser: 0.37 mg/dL — ABNORMAL LOW (ref 0.44–1.00)
GFR calc Af Amer: >60 mL/min (ref >60)
GFR calc non Af Amer: >60 mL/min (ref >60)
Glucose, Bld: 223 mg/dL — ABNORMAL HIGH (ref 70–99)
Glucose, Bld: 236 mg/dL — ABNORMAL HIGH (ref 70–99)
Potassium: 4.4 mmol/L (ref 3.5–5.1)
Potassium: 4 mmol/L (ref 3.5–5.1)
Sodium: 141 mmol/L (ref 135–145)
Sodium: 139 mmol/L (ref 135–145)
Total Bilirubin: 0.6 mg/dL (ref 0.3–1.2)
Total Bilirubin: 0.4 mg/dL (ref 0.3–1.2)
Total Protein: 3.9 g/dL — ABNORMAL LOW (ref 6.5–8.1)
Total Protein: 4.8 g/dL — ABNORMAL LOW (ref 6.5–8.1)

## 2019-01-18 LAB — CBC WITH DIFFERENTIAL/PLATELET
Abs Immature Granulocytes: 0.13 10*3/uL — ABNORMAL HIGH (ref 0.00–0.07)
Abs Immature Granulocytes: 0.2 10*3/uL — ABNORMAL HIGH (ref 0.00–0.07)
Basophils Absolute: 0 10*3/uL (ref 0.0–0.1)
Basophils Absolute: 0 10*3/uL (ref 0.0–0.1)
Basophils Relative: 0 %
Basophils Relative: 0 %
Eosinophils Absolute: 0 10*3/uL (ref 0.0–0.5)
Eosinophils Absolute: 0 10*3/uL (ref 0.0–0.5)
Eosinophils Relative: 0 %
Eosinophils Relative: 0 %
HCT: 31.3 % — ABNORMAL LOW (ref 36.0–46.0)
HCT: 37.7 % (ref 36.0–46.0)
Hemoglobin: 9.7 g/dL — ABNORMAL LOW (ref 12.0–15.0)
Hemoglobin: 11.6 g/dL — ABNORMAL LOW (ref 12.0–15.0)
Immature Granulocytes: 2 %
Immature Granulocytes: 1 %
Lymphocytes Relative: 8 %
Lymphocytes Relative: 4 %
Lymphs Abs: 0.7 10*3/uL (ref 0.7–4.0)
Lymphs Abs: 0.7 10*3/uL (ref 0.7–4.0)
MCH: 29.8 pg (ref 26.0–34.0)
MCH: 29.2 pg (ref 26.0–34.0)
MCHC: 31 g/dL (ref 30.0–36.0)
MCHC: 30.8 g/dL (ref 30.0–36.0)
MCV: 96.3 fL (ref 80.0–100.0)
MCV: 95 fL (ref 80.0–100.0)
Monocytes Absolute: 0.5 10*3/uL (ref 0.1–1.0)
Monocytes Absolute: 0.3 10*3/uL (ref 0.1–1.0)
Monocytes Relative: 6 %
Monocytes Relative: 2 %
Neutro Abs: 7.1 10*3/uL (ref 1.7–7.7)
Neutro Abs: 16.5 10*3/uL — ABNORMAL HIGH (ref 1.7–7.7)
Neutrophils Relative %: 84 %
Neutrophils Relative %: 93 %
Platelets: 321 10*3/uL (ref 150–400)
Platelets: 432 10*3/uL — ABNORMAL HIGH (ref 150–400)
RBC: 3.25 MIL/uL — ABNORMAL LOW (ref 3.87–5.11)
RBC: 3.97 MIL/uL (ref 3.87–5.11)
RDW: 14.3 % (ref 11.5–15.5)
RDW: 14.3 % (ref 11.5–15.5)
WBC: 8.5 10*3/uL (ref 4.0–10.5)
WBC: 17.7 10*3/uL — ABNORMAL HIGH (ref 4.0–10.5)
nRBC: 0 % (ref 0.0–0.2)
nRBC: 0 % (ref 0.0–0.2)

## 2019-01-18 LAB — URINALYSIS, COMPLETE (UACMP) WITH MICROSCOPIC
Bacteria, UA: NONE SEEN
Bilirubin Urine: NEGATIVE
Glucose, UA: 50 mg/dL — AB
Hgb urine dipstick: NEGATIVE
Ketones, ur: NEGATIVE mg/dL
Nitrite: POSITIVE — AB
Protein, ur: 30 mg/dL — AB
Specific Gravity, Urine: 1.027 (ref 1.005–1.030)
WBC, UA: >50 WBC/hpf — ABNORMAL HIGH (ref 0–5)
pH: 5 (ref 5.0–8.0)

## 2019-01-18 LAB — BLOOD GAS, ARTERIAL
Acid-Base Excess: 7.5 mmol/L — ABNORMAL HIGH (ref 0.0–2.0)
Bicarbonate: 34.1 mmol/L — ABNORMAL HIGH (ref 20.0–28.0)
FIO2: 1
MECHVT: 500 mL
Mechanical Rate: 20
O2 Saturation: 99.8 %
PEEP: 5 cmH2O
Patient temperature: 37
pCO2 arterial: 55 mmHg — ABNORMAL HIGH (ref 32.0–48.0)
pH, Arterial: 7.4 (ref 7.350–7.450)
pO2, Arterial: 249 mmHg — ABNORMAL HIGH (ref 83.0–108.0)

## 2019-01-18 LAB — FERRITIN: Ferritin: 218 ng/mL (ref 11–307)

## 2019-01-18 LAB — BLOOD CULTURE ID PANEL (REFLEXED)

## 2019-01-18 LAB — TYPE AND SCREEN
ABO/RH(D): O POS
Antibody Screen: NEGATIVE

## 2019-01-18 LAB — TROPONIN I (HIGH SENSITIVITY)
Troponin I (High Sensitivity): 88 ng/L — ABNORMAL HIGH (ref <18)
Troponin I (High Sensitivity): 91 ng/L — ABNORMAL HIGH (ref <18)
Troponin I (High Sensitivity): 56 ng/L — ABNORMAL HIGH (ref <18)

## 2019-01-18 LAB — HEPATITIS B SURFACE ANTIGEN: Hepatitis B Surface Ag: NONREACTIVE

## 2019-01-18 LAB — FIBRINOGEN: Fibrinogen: 308 mg/dL (ref 210–475)

## 2019-01-18 LAB — PROCALCITONIN
Procalcitonin: <0.1 ng/mL
Procalcitonin: <0.1 ng/mL

## 2019-01-18 LAB — SARS CORONAVIRUS 2 BY RT PCR (HOSPITAL ORDER, PERFORMED IN ~~LOC~~ HOSPITAL LAB): SARS Coronavirus 2: POSITIVE — AB

## 2019-01-18 LAB — LACTIC ACID, PLASMA: Lactic Acid, Venous: 2.7 mmol/L (ref 0.5–1.9)

## 2019-01-18 LAB — BRAIN NATRIURETIC PEPTIDE: B Natriuretic Peptide: 1389.8 pg/mL — ABNORMAL HIGH (ref 0.0–100.0)

## 2019-01-18 LAB — ABO/RH: ABO/RH(D): O POS

## 2019-01-18 LAB — C-REACTIVE PROTEIN: CRP: 2 mg/dL — ABNORMAL HIGH (ref <1.0)

## 2019-01-18 LAB — LACTATE DEHYDROGENASE: LDH: 198 U/L — ABNORMAL HIGH (ref 98–192)

## 2019-01-18 LAB — D-DIMER, QUANTITATIVE: D-Dimer, Quant: 7.19 ug/mL-FEU — ABNORMAL HIGH (ref 0.00–0.50)

## 2019-01-18 MED ORDER — IPRATROPIUM BROMIDE HFA 17 MCG/ACT IN AERS
2.0000 | INHALATION_SPRAY | Freq: Four times a day (QID) | RESPIRATORY_TRACT | Status: DC
  Filled 2019-01-18: qty 12.9

## 2019-01-18 MED ORDER — SUCCINYLCHOLINE CHLORIDE 20 MG/ML IJ SOLN
INTRAMUSCULAR | Status: AC | PRN
  Administered 2019-01-18: 200 mg via INTRAVENOUS

## 2019-01-18 MED ORDER — SODIUM CHLORIDE 0.9 % IV BOLUS (SEPSIS)
1000.0000 mL | Freq: Once | INTRAVENOUS | Status: AC
  Administered 2019-01-18: 1000 mL via INTRAVENOUS

## 2019-01-18 MED ORDER — PROPOFOL 10 MG/ML IV BOLUS
1.0000 mg/kg | Freq: Once | INTRAVENOUS | Status: AC
  Administered 2019-01-18: 03:00:00 57 mg via INTRAVENOUS

## 2019-01-18 MED ORDER — PROPOFOL 1000 MG/100ML IV EMUL
INTRAVENOUS | Status: AC
  Administered 2019-01-18: 02:00:00 via INTRAVENOUS
  Filled 2019-01-18: qty 100

## 2019-01-18 MED ORDER — SODIUM CHLORIDE 0.9 % IV SOLN: 250.0000 mL | INTRAVENOUS | Status: DC

## 2019-01-18 MED ORDER — ACETAMINOPHEN 650 MG RE SUPP: 650.0000 mg | Freq: Four times a day (QID) | RECTAL | Status: DC | PRN

## 2019-01-18 MED ORDER — PROPOFOL 1000 MG/100ML IV EMUL
5.0000 ug/kg/min | INTRAVENOUS | Status: DC
  Administered 2019-01-18: 15 ug/kg/min via INTRAVENOUS
  Administered 2019-01-18: 40 ug/kg/min via INTRAVENOUS
  Filled 2019-01-18: qty 100

## 2019-01-18 MED ORDER — SODIUM CHLORIDE 0.9 % IV SOLN
1000.0000 mL | INTRAVENOUS | Status: DC
Start: 2019-01-18 — End: 2019-01-18
  Administered 2019-01-18 (×2): 1000 mL via INTRAVENOUS

## 2019-01-18 MED ORDER — DEXTROSE 5 % IV SOLN
INTRAVENOUS | Status: DC
  Administered 2019-01-18: 16:00:00 via INTRAVENOUS

## 2019-01-18 MED ORDER — PIPERACILLIN-TAZOBACTAM 3.375 G IVPB 30 MIN
3.3750 g | Freq: Once | INTRAVENOUS | Status: AC
  Administered 2019-01-18: 03:00:00 3.375 g via INTRAVENOUS
  Filled 2019-01-18: qty 50

## 2019-01-18 MED ORDER — PROPOFOL 1000 MG/100ML IV EMUL
0.0000 ug/kg/min | INTRAVENOUS | Status: DC
  Administered 2019-01-18: 20 ug/kg/min via INTRAVENOUS
  Filled 2019-01-18: qty 100

## 2019-01-18 MED ORDER — ZINC SULFATE 220 (50 ZN) MG PO CAPS
220.0000 mg | ORAL_CAPSULE | Freq: Every day | ORAL | Status: DC
  Administered 2019-01-18: 220 mg
  Filled 2019-01-18: qty 1

## 2019-01-18 MED ORDER — GLYCOPYRROLATE 0.2 MG/ML IJ SOLN: 0.2000 mg | INTRAMUSCULAR | Status: DC | PRN

## 2019-01-18 MED ORDER — ENOXAPARIN SODIUM 30 MG/0.3ML ~~LOC~~ SOLN
30.0000 mg | Freq: Once | SUBCUTANEOUS | Status: AC
  Administered 2019-01-18: 30 mg via SUBCUTANEOUS
  Filled 2019-01-18: qty 0.3

## 2019-01-18 MED ORDER — ENOXAPARIN SODIUM 40 MG/0.4ML ~~LOC~~ SOLN: 40.0000 mg | Freq: Every day | SUBCUTANEOUS | Status: DC

## 2019-01-18 MED ORDER — METHYLPREDNISOLONE SODIUM SUCC 125 MG IJ SOLR: 60.0000 mg | Freq: Three times a day (TID) | INTRAMUSCULAR | Status: DC

## 2019-01-18 MED ORDER — FAMOTIDINE IN NACL 20-0.9 MG/50ML-% IV SOLN
20.0000 mg | Freq: Two times a day (BID) | INTRAVENOUS | Status: DC
  Administered 2019-01-18: 20 mg via INTRAVENOUS
  Filled 2019-01-18: qty 50

## 2019-01-18 MED ORDER — POLYVINYL ALCOHOL 1.4 % OP SOLN
1.0000 [drp] | Freq: Four times a day (QID) | OPHTHALMIC | Status: DC | PRN
  Filled 2019-01-18: qty 15

## 2019-01-18 MED ORDER — ETOMIDATE 2 MG/ML IV SOLN
INTRAVENOUS | Status: AC | PRN
  Administered 2019-01-18: 20 mg via INTRAVENOUS

## 2019-01-18 MED ORDER — NOREPINEPHRINE 4 MG/250ML-% IV SOLN
2.0000 ug/min | INTRAVENOUS | Status: DC
  Administered 2019-01-18: 2 ug/min via INTRAVENOUS
  Filled 2019-01-18: qty 250

## 2019-01-18 MED ORDER — ENOXAPARIN SODIUM 40 MG/0.4ML ~~LOC~~ SOLN
40.0000 mg | Freq: Two times a day (BID) | SUBCUTANEOUS | Status: DC
  Administered 2019-01-18: 40 mg via SUBCUTANEOUS
  Filled 2019-01-18: qty 0.4

## 2019-01-18 MED ORDER — NOREPINEPHRINE 4 MG/250ML-% IV SOLN: 0.0000 ug/min | INTRAVENOUS | Status: DC

## 2019-01-18 MED ORDER — CHLORHEXIDINE GLUCONATE CLOTH 2 % EX PADS
6.0000 | MEDICATED_PAD | Freq: Every day | CUTANEOUS | Status: DC
  Administered 2019-01-18: 6 via TOPICAL

## 2019-01-18 MED ORDER — DEXAMETHASONE SODIUM PHOSPHATE 10 MG/ML IJ SOLN
10.0000 mg | Freq: Once | INTRAMUSCULAR | Status: AC
  Administered 2019-01-18: 10 mg via INTRAVENOUS
  Filled 2019-01-18: qty 1

## 2019-01-18 MED ORDER — ALBUMIN HUMAN 25 % IV SOLN
50.0000 g | Freq: Once | INTRAVENOUS | Status: AC
  Administered 2019-01-18: 12.5 g via INTRAVENOUS
  Filled 2019-01-18: qty 100

## 2019-01-18 MED ORDER — PROPOFOL 10 MG/ML IV BOLUS
0.5000 mg/kg | Freq: Once | INTRAVENOUS | Status: AC
  Administered 2019-01-18: 02:00:00 via INTRAVENOUS

## 2019-01-18 MED ORDER — VITAMIN C 500 MG PO TABS
500.0000 mg | ORAL_TABLET | Freq: Every day | ORAL | Status: DC
  Administered 2019-01-18: 500 mg
  Filled 2019-01-18: qty 1

## 2019-01-18 MED ORDER — FENTANYL CITRATE (PF) 100 MCG/2ML IJ SOLN
25.0000 ug | INTRAMUSCULAR | Status: DC | PRN
  Administered 2019-01-18 (×2): 100 ug via INTRAVENOUS
  Filled 2019-01-18 (×2): qty 2

## 2019-01-18 MED ORDER — MIDAZOLAM HCL 2 MG/2ML IJ SOLN
2.0000 mg | INTRAMUSCULAR | Status: DC | PRN
  Administered 2019-01-18 (×2): 2 mg via INTRAVENOUS
  Filled 2019-01-18: qty 4

## 2019-01-18 MED ORDER — FENTANYL CITRATE (PF) 100 MCG/2ML IJ SOLN: 25.0000 ug | INTRAMUSCULAR | Status: DC | PRN

## 2019-01-18 MED ORDER — GLYCOPYRROLATE 1 MG PO TABS
1.0000 mg | ORAL_TABLET | ORAL | Status: DC | PRN
  Filled 2019-01-18: qty 1

## 2019-01-18 MED ORDER — ACETAMINOPHEN 325 MG PO TABS: 650.0000 mg | ORAL_TABLET | Freq: Four times a day (QID) | ORAL | Status: DC | PRN

## 2019-01-18 MED ORDER — DEXAMETHASONE SODIUM PHOSPHATE 10 MG/ML IJ SOLN: 6.0000 mg | INTRAMUSCULAR | Status: DC

## 2019-01-18 MED ORDER — SODIUM CHLORIDE 0.9 % IV BOLUS
1000.0000 mL | Freq: Once | INTRAVENOUS | Status: AC
  Administered 2019-01-18: 1000 mL via INTRAVENOUS

## 2019-01-18 MED ORDER — SODIUM CHLORIDE 0.9 % IV BOLUS (SEPSIS)
1000.0000 mL | Freq: Once | INTRAVENOUS | Status: AC
  Administered 2019-01-18: 05:00:00 1000 mL via INTRAVENOUS

## 2019-01-18 MED ORDER — ENOXAPARIN SODIUM 80 MG/0.8ML ~~LOC~~ SOLN: 70.0000 mg | Freq: Two times a day (BID) | SUBCUTANEOUS | Status: DC

## 2019-01-18 MED ORDER — DIPHENHYDRAMINE HCL 50 MG/ML IJ SOLN: 25.0000 mg | INTRAMUSCULAR | Status: DC | PRN

## 2019-01-18 MED ORDER — SODIUM CHLORIDE 0.9 % IV BOLUS
1000.0000 mL | Freq: Once | INTRAVENOUS | Status: AC
  Administered 2019-01-18: 02:00:00 1000 mL via INTRAVENOUS

## 2019-01-18 MED ORDER — VANCOMYCIN HCL IN DEXTROSE 1-5 GM/200ML-% IV SOLN
1000.0000 mg | Freq: Once | INTRAVENOUS | Status: AC
  Administered 2019-01-18: 1000 mg via INTRAVENOUS
  Filled 2019-01-18: qty 200

## 2019-01-18 NOTE — H&P (Signed)
[facility] History and Physical  Priscilla Johnson PNT:[id number] DOB: 07-Mar-1946 DOA: 01-Feb-2019  Referring physician:  PCP: Charlott Rakes, MD   Chief Complaint:   HPI: Priscilla Johnson is a 73 y.o. WF PMHx essential HTN, chronic respiratory failure with hypoxia on 3 L O2 at home, OSA, diabetes type 2 controlled with complication, CKD stage III, HLD, severe obesity, anxiety  Discharged on 9/29 from [facility] for COVID pneumonia Patient admitted to [facility] for acute on chronic hypoxic respiratory failure in the setting of COVID-19 pneumonia.  Patient symptoms have now resolved, patient has completed Remdesivir course, continue steroids.  Patient is bedbound per report, she indicates she has not walked in over a year.  Patient had CT chest given ongoing episodes of tachycardia and tachypnea although without hypoxia.  CT does show some atelectasis and likely debris in left bronchus concerning for aspiration, speech evaluated, patient placed on dysphagia 2 diet with chopped foods and thin liquids.  Concerning history of anxiety given whenever discussion of disposition back to [facility] nursing facility as outpatient becomes quite overtly anxious, diaphoretic, tachypneic, tachycardic.  Unclear anxiety history per patient or family however patient has improved quite well during her events on very low-dose 0.5 mg of Ativan.  Certainly would not continue this in the outpatient setting given her advanced age but patient would benefit from evaluation for long-term anxiety medications that are not benzodiazepines.  We discussed at length with patient and family if she is truly unhappy at her facility she should be able to change facilities if 1 can be available given her COVID-19 pneumonia diagnosis recently.  At this time patient is stable for discharge back to SNF, defer to case management for discharge back to [facility] nursing facility versus other facility per their discussion.      Review of Systems:   Constitutional:  No weight loss, night sweats, Fevers, chills, fatigue.  HEENT:  No headaches, Difficulty swallowing,Tooth/dental problems,Sore throat,  No sneezing, itching, ear ache, nasal congestion, post nasal drip,  Cardio-vascular:  No chest pain, Orthopnea, PND, positive+1 swelling in lower extremities, anasarca, dizziness, palpitations  GI:  No heartburn, indigestion, abdominal pain, nausea, vomiting, diarrhea, change in bowel habits, loss of appetite  Resp:  No shortness of breath with exertion or at rest. No excess mucus, no productive cough, No non-productive cough, No coughing up of blood.No change in color of mucus.No wheezing.No chest wall deformity, intubated upon arrival Skin:  no rash or lesions.  GU:  no dysuria, change in color of urine, no urgency or frequency. No flank pain.  Musculoskeletal:  No joint pain or swelling. No decreased range of motion. No back pain.  Psych:  No change in mood or affect. No depression or anxiety. No memory loss.   Past Medical History:  Diagnosis Date   COVID-19 01-Feb-2019   Diabetes mellitus without complication (HCC)    Gout    Hyperlipidemia    Hypertension    Insomnia    Shoulder pain, right    Sleep apnea    Past Surgical History:  Procedure Laterality Date   ABDOMINAL HYSTERECTOMY     Social History:  reports that she has never smoked. She has never used smokeless tobacco. She reports current alcohol use. She reports that she does not use drugs.  No Known Allergies  Family History  Problem Relation Age of Onset   Stroke Mother    Diabetes Brother    Diabetes Maternal Grandfather    Diabetes Brother    Sleep apnea  Brother     Prior to Admission medications   Medication Sig Start Date End Date Taking? Authorizing Provider  acetaminophen (TYLENOL) 325 MG tablet Take 650 mg by mouth every 4 (four) hours as needed for mild pain.    [provider]  amLODipine (NORVASC) 5 MG tablet Take 1  tablet (5 mg total) by mouth daily. 10/30/17   Gladstone Lighter, MD  atenolol (TENORMIN) 25 MG tablet Take 25 mg by mouth daily. 01/07/19   [provider]  Cranberry 450 MG CAPS Take 900 mg by mouth 2 (two) times daily.    [provider]  febuxostat (ULORIC) 40 MG tablet Take 40 mg by mouth daily. 01/09/19   [provider]  fluocinonide ointment (LIDEX) 6.78 % Apply 1 application topically as needed (dermatitis).  04/17/15   [provider]  HYDROcodone-acetaminophen (NORCO/VICODIN) 5-325 MG tablet Take 1 tablet by mouth every 6 (six) hours as needed for up to 5 days for moderate pain. 01/17/19 01/22/19  Little Ishikawa, MD  lisinopril (ZESTRIL) 10 MG tablet Take 10 mg by mouth daily. 12/24/18   [provider]  Melatonin-Pyridoxine (MELATIN PO) Take 1 tablet by mouth at bedtime.    [provider]  metFORMIN (GLUCOPHAGE) 500 MG tablet TAKE 1 TABLET BY MOUTH TWO  TIMES DAILY Patient taking differently: Take 500 mg by mouth 2 (two) times daily with a meal.  10/19/17   Kathrine Haddock, NP  OXYGEN Inhale 2 L into the lungs as needed (shortness of breath).    [provider]  polyethylene glycol (MIRALAX / GLYCOLAX) packet Take 17 g by mouth daily. 10/31/17   Gladstone Lighter, MD  predniSONE (DELTASONE) 10 MG tablet Take 2 tablets (20 mg total) by mouth daily for 3 days, THEN 1 tablet (10 mg total) daily for 3 days. 01/17/19 01/23/19  Little Ishikawa, MD     Consultants:  PCCM   Procedures/Significant Events:  9/30 intubated at [facility] 9/30 transported [place]   I have personally reviewed and interpreted all radiology studies and my findings are as above.   VENTILATOR SETTINGS: Vent mode; PRVC Vt Set; 490 Set rate; 20 FiO2; 60% I time; 0.8 PEEP; 5   Cultures 9/30 SARS coronavirus positive 9/30 urine pending 9/30 blood pending 9/30 sputum pending 9/30 respiratory virus panel  pending    Antimicrobials: Anti-infectives (From admission, onward)   None       Devices    LINES / TUBES:  #7.5 cuffed ETT in place 9/30>>    Continuous Infusions:  Physical Exam: Vitals:   12/25/2018 1200 01/08/2019 1215 12/21/2018 1230 12/28/2018 1245  BP: 112/60 (!) 112/54 (!) 111/59 (!) 113/56  Pulse: (!) 58 (!) 58 (!) 58 (!) 58  Resp: 20 20 20 20   Temp: 98.8 F (37.1 C) 98.8 F (37.1 C) 98.8 F (37.1 C) 99 F (37.2 C)  SpO2: 100% 100% 100% 100%    Wt Readings from Last 3 Encounters:  12/23/2018 68.4 kg  01/12/19 68.4 kg  01/12/19 72.1 kg    General: A/O x0 (intubated), positive acute on chronic respiratory distress Eyes: negative scleral hemorrhage, negative anisocoria, negative icterus ENT: Negative Runny nose, negative gingival bleeding, #7.5 ETT in place Neck:  Negative scars, masses, torticollis, lymphadenopathy, JVD Lungs: Coarse breath sounds LEFT>RIGHT, negative  wheezes or crackles Cardiovascular: Bradycardic, without murmur gallop or rub normal S1 and S2 Abdomen: Morbidly obese, negative abdominal pain, nondistended, positive soft, bowel sounds, no rebound, no ascites, no appreciable mass Extremities:  No significant cyanosis, clubbing, or positive +1 bilateral  lower extremities Skin: Negative rashes, lesions, ulcers Psychiatric: Unable to evaluate secondary to intubation  Central nervous system: Unable to evaluate secondary to intubation and sedation        Labs on Admission:  Basic Metabolic Panel: Recent Labs  Lab 01/15/19 0229 01/16/19 0220 01/17/19 0220 01/21/2019 0204 2019-01-21 1140  NA 136 137 137 141 139  K 4.0 4.6 4.8 4.4 4.0  CL 95* 93* 94* 98 103  CO2 34* 33* 35* 32 27  GLUCOSE 241* 270* 193* 223* 236*  BUN 28* 26* 28* 24* 22  CREATININE 0.38* <0.30* 0.34* <0.30* 0.37*  CALCIUM 7.9* 7.8* 8.3* 6.9* 7.3*   Liver Function Tests: Recent Labs  Lab 01/15/19 0229 01/16/19 0220 01/17/19 0220 01/21/2019 0204 January 21, 2019 1140  AST 16 14*  ALT ALKPHOS 55 62 54 42 58  BILITOT 0.1* 0.3 0.3 0.6 0.4  PROT 5.1* 5.2* 5.2* 3.9* 4.8*  ALBUMIN 2.5* 2.6* 2.6* 1.9* 2.4*   No results for input(s): LIPASE, AMYLASE in the last 168 hours. No results for input(s): AMMONIA in the last 168 hours. CBC: Recent Labs  Lab 01/15/19 0229 01/16/19 0220 01/17/19 0220 Jan 21, 2019 0204 January 21, 2019 1140  WBC 6.2 5.3 8.7 8.5 17.7*  NEUTROABS 5.4 4.6 7.7 7.1 16.5*  HGB 11.9* 11.9* 12.4 9.7* 11.6*  HCT 38.8 39.8 40.8 31.3* 37.7  MCV 96.3 96.4 96.0 96.3 95.0  PLT 350 341 369 321 432*   Cardiac Enzymes: No results for input(s): CKTOTAL, CKMB, CKMBINDEX, TROPONINI in the last 168 hours.  BNP (last 3 results) No results for input(s): BNP in the last 8760 hours.  ProBNP (last 3 results) No results for input(s): PROBNP in the last 8760 hours.  CBG: Recent Labs  Lab 01/16/19 1228 01/16/19 1604 01/16/19 2306 01/17/19 0735 01/17/19 1226  GLUCAP 207* 201* 124* 225* 227*    Radiological Exams on Admission: Dg Chest Port 1 View  Result Date: Jan 21, 2019 CLINICAL DATA:  Respiratory distress. Recent diagnosis and hospitalization 4 COVID-19 pneumonia EXAM: PORTABLE CHEST 1 VIEW COMPARISON:  Radiograph 01/15/2019, CT 01/16/2019 FINDINGS: Endotracheal tube tip 2.7 cm from the carina. Enteric tube tip and side-port below the diaphragm in the stomach. Heterogeneous bilateral lung opacities consistent with COVID-19 pneumonia. Persistent but improved volume loss and retrocardiac opacities since prior exam. No pneumothorax. Small bilateral pleural effusions. IMPRESSION: 1. Endotracheal and enteric tubes in appropriate position. 2. Heterogeneous bilateral lung opacities consistent with COVID-19 pneumonia. 3. Persistent but improved volume loss at the left lung base and retrocardiac opacities since prior exam. 4. Small bilateral pleural effusions. Electronically Signed   By: Narda Rutherford M.D.   On: 01-21-2019 02:15    EKG: Independently  reviewed.-Right and left arm electrode reversal, interpretation assumes no reversal, NSR,-Probable lateral infarct, old -Anteroseptal infarct, age indeterminate -Abnormal T, consider ischemia, inferior leads When compared to previous EKG no significant change.  Assessment/Plan Active Problems:   Sleep apnea   CKD (chronic kidney disease), stage III (HCC)   Hyperlipidemia   Severe obesity (BMI 35.0-35.9 with comorbidity) (HCC)   Essential hypertension   Pneumonia due to COVID-19 virus   Chronic respiratory failure with hypoxia (HCC)   Diabetes mellitus type 2, controlled, with complications (HCC)   Acute on chronic respiratory failure with hypoxia/COVID 19 pneumonia - Restart Decadron 6 mg daily - Patient completed course of Remdesivir 9/29 prior to discharge - Considering Actemra given patient's relapse within 24-hour.  However unable to contact patient's husband at this time for consent.  Unsure that patient would benefit, however this is probably the last treatment available to patient.  Given patient's poor overall living conditions not a candidate for COVID convalescent plasma. Recent Labs  Lab 01/14/19 0243 01/15/19 0229 01/16/19 0220 01/17/19 0220 01/12/2019 1140  CRP 2.6* 1.1* 0.8 <0.8 2.0*   Recent Labs  Lab 01/12/19 2050 01/11/2019 1140  DDIMER 0.52* 7.19*   HCAP/Bacterial pneumonia? -Obtain sputum culture -We will hold on starting antibiotics, with a procalcitonin 0.10 -Ipratropium QID  PE? - Patient's d-dimer increased. - We will start patient on full dose Lovenox until we can obtain CTA PE protocol  Essential HTN/Hypotension - Currently hypotensive patient [facility] L [facility] - Currently on Levophed.  Titrate down as tolerated maintain MAP> 65 - Patient also malnourished albumin 2.4.  Administer albumin 50 g  CKD stage III (baseline Cr 1.04, last obtained 10/30/2017) Recent Labs  Lab 01/15/19 0229 01/16/19 0220  01/17/19 0220 12/21/2018 0204 01/06/2019 1140  CREATININE 0.38* <0.30* 0.34* <0.30* 0.37*    Elevated troponin/demand ischemia - TroponinI HS: 88/91/56 -Slight bump most likely secondary to demand ischemia, secondary to hypotension appears to be trending down. - Abnormal EKG when compared to previous hospitalization no significant change  HLD  Severe obesity  Goals of care -9/30 Dr. Briant SitesJessica Marshall has attempted to contact husband in order to discuss DNR status, goals of care given patient's hospitalization with a discharge of less than 24 hours rehospitalized and reintubated.   Hyperlipidemia -Continue home medications  Severe obesity -Lengthy discussion at bedside about dietary and lifestyle changes  Code Status: Full (DVT Prophylaxis: Lovenox full dose Family Communication: 9/30 Dr. Briant SitesJessica Marshall has attempted to contact husband left message Disposition Plan: TBD   Data Reviewed: Care during the described time interval was provided by me .  I have reviewed this patient's available data, including medical history, events of note, physical examination, and all test results as part of my evaluation.   The patient is critically ill with multiple organ systems failure and requires high complexity decision making for assessment and support, frequent evaluation and titration of therapies, application of advanced monitoring technologies and extensive interpretation of multiple databases. Critical Care Time devoted to patient care services described in this note  Time spent: 70 minutes   Daizha Anand, Roselind MessierCURTIS J [facility] Pager [phone]

## 2019-01-18 NOTE — ED Notes (Signed)
Dr Beather Arbour at bedside for central line placement

## 2019-01-18 NOTE — ED Notes (Signed)
Comfortable, resting

## 2019-01-18 NOTE — Progress Notes (Signed)
ANTICOAGULATION CONSULT NOTE - Initial Consult  Pharmacy Consult for Lovenox Indication: R/o VTE  No Known Allergies  Patient Measurements:   Actual body weight: 68.4kg   Vital Signs: Temp: 99 F (37.2 C) (09/30 1245) BP: 113/56 (09/30 1245) Pulse Rate: 58 (09/30 1245)  Labs: Recent Labs    01/17/19 0220 12/25/2018 0204 12/21/2018 0408 01/06/2019 1140  HGB 12.4 9.7*  --  11.6*  HCT 40.8 31.3*  --  37.7  PLT 369 321  --  432*  CREATININE 0.34* <0.30*  --  0.37*  TROPONINIHS  --  88* 91* 56*    Estimated Creatinine Clearance: 60 mL/min (A) (by C-G formula based on SCr of 0.37 mg/dL (L)).   Medical History: Past Medical History:  Diagnosis Date  . COVID-19 12/28/2018  . Diabetes mellitus without complication ([place])   . Gout   . Hyperlipidemia   . Hypertension   . Insomnia   . Shoulder pain, right   . Sleep apnea     Medications:  Medications Prior to Admission  Medication Sig Dispense Refill Last Dose  . acetaminophen (TYLENOL) 325 MG tablet Take 650 mg by mouth every 4 (four) hours as needed for mild pain.     Marland Kitchen amLODipine (NORVASC) 5 MG tablet Take 1 tablet (5 mg total) by mouth daily. 30 tablet 2   . atenolol (TENORMIN) 25 MG tablet Take 25 mg by mouth daily.     . Cranberry 450 MG CAPS Take 900 mg by mouth 2 (two) times daily.     . febuxostat (ULORIC) 40 MG tablet Take 40 mg by mouth daily.     . fluocinonide ointment (LIDEX) 1.61 % Apply 1 application topically as needed (dermatitis).      Marland Kitchen HYDROcodone-acetaminophen (NORCO/VICODIN) 5-325 MG tablet Take 1 tablet by mouth every 6 (six) hours as needed for up to 5 days for moderate pain. 20 tablet 0   . lisinopril (ZESTRIL) 10 MG tablet Take 10 mg by mouth daily.     . Melatonin-Pyridoxine (MELATIN PO) Take 1 tablet by mouth at bedtime.     . metFORMIN (GLUCOPHAGE) 500 MG tablet TAKE 1 TABLET BY MOUTH TWO  TIMES DAILY (Patient taking differently: Take 500 mg by mouth 2 (two) times daily with a meal. ) 180 tablet 1    . OXYGEN Inhale 2 L into the lungs as needed (shortness of breath).     . polyethylene glycol (MIRALAX / GLYCOLAX) packet Take 17 g by mouth daily. 14 each 0   . predniSONE (DELTASONE) 10 MG tablet Take 2 tablets (20 mg total) by mouth daily for 3 days, THEN 1 tablet (10 mg total) daily for 3 days. 9 tablet 0     Assessment: 20 YOF who was recently discharged from [facility] returns with hypoxic respiratory failure. Pharmacy consulted to start treatment dose Lovenox due to concern for VTE. D-dimer is elevated at 7.19. CBC reviewed. SCr 0.37. Of note, patient received a dose of Lovenox 40 mg once today.   Goal of Therapy:  Anti-Xa level 0.6-1 units/ml 4hrs after LMWH dose given Monitor platelets by anticoagulation protocol: Yes   Plan:  -Start Lovenox 1 mg/kg (70 mg) Q 12 hours. Will only give Lovenox 30 mg once now for total of 70 mg now.  -Monitor CBC, renal fx, and s/s of bleeding  Albertina Parr, PharmD., BCPS Clinical Pharmacist Clinical phone for 01/02/2019 until 5pm: [phone]

## 2019-01-18 NOTE — ED Notes (Signed)
Spoke tp Estill Bamberg at [facility]. Unable to transport pt at this time due to instability. Will reassess in approx 1 hour

## 2019-01-18 NOTE — ED Notes (Signed)
Attempted to call Husband x 2 without response, left hippa compliant message.

## 2019-01-18 NOTE — Consult Note (Signed)
NAME:  Priscilla Johnson, MRN:  [id number], DOB:  07/07/1945, LOS: 0 ADMISSION DATE:  02-01-19, CONSULTATION DATE:  02/01/19 REFERRING MD:  Dr Sherral Hammers, CHIEF COMPLAINT:  Shock with covid-19  Brief History   73 yo female just d/c'd from [facility] 9/29 with covid-19 pna to snf. She returned emergently to [facility] with hypotension and hypoxia to the 60's.   History of present illness   From H&P on 9/24: Priscilla Johnson is a 73 y.o. female with medical history significant of dm, htn comes in with one week of worsening sob and cough.  Chronically on 3 liters at home of oxygen.  No fevers.  No n/v/ some diarrhea.  No abd pain or chest pain.  Referred for admit for covid pna.  Has been on po abx and steroids for last 4 days and not better.  Stable on her 3 liters [place].    Update 9/30: Pt was discharged on 9/29 back to snf (she is bedbound, not walking in over a year) on her home oxygen. She is s/p remdesivir and 5 days decadron. She was additionally discharged on steroid taper.   Unfortunately, pt represented to [facility] with hypoxia in the 60's and hypotension req 3-4 L NS bolus without improvement and started on vasopressors. Pt was subsequently intubated due to hypoxia and unresponsiveness. She was transferred to [facility].   Upon presentation to our ICU pt is intubated and sedated, cool to the touch. With 4 small bore piv in the fingers. She is on elevated vent settings and completely unresponsive.    Past Medical History   Past Medical History:  Diagnosis Date  . COVID-19 2019-02-01  . Diabetes mellitus without complication ([place])   . Gout   . Hyperlipidemia   . Hypertension   . Insomnia   . Shoulder pain, right   . Sleep apnea     Significant Hospital Events   9/29: dc'd from [facility] 9/30: readmitted to [facility]:  9/30 CCM  Procedures:  9/30 ett->  Significant Diagnostic Tests:  9/28 ctap: neg for pe  Micro Data:  9/30 blood:  9/30 resp:  9/30 RVP: 9/30 urine: 9/30 sars2:  POSITIVE  Antimicrobials:  Cefepime 9/30-> vanc 9/30->  Interim history/subjective:    Objective   Blood pressure (!) 95/52, pulse (!) 57, temperature 98.8 F (37.1 C), resp. rate 20, SpO2 100 %.    Vent Mode: PRVC FiO2 (%):  [60 %-100 %] 60 % Set Rate:  [20 bmp] 20 bmp Vt Set:  [490 mL-500 mL] 490 mL PEEP:  [5 cmH20] 5 cmH20 Plateau Pressure:  [19 cmH20-20 cmH20] 20 cmH20  No intake or output data in the 24 hours ending 02/01/19 1219 There were no vitals filed for this visit.  Examination: General: no acute distress, chronically ill appearing intubated and sedated HEENT: NCAT,  PERRLA, MMMP Lungs: rhonchi bilaterally L greater than R Cardiovascular: RRR, no m/g/r Abdomen: obese soft, NT,ND, BS+ Extremities: + edema, no clubbing of cyanosis. Scaling skin on feet Skin: no rashes, cool to touch but dry Neuro: unresponsive on sedation   Resolved Hospital Problem list     Assessment & Plan:  Acute on chronic hypoxic resp failure covid-19 pna ?PE -titrate vent as able -recx sputum  -empiric abx.  -completed remdesivir -will restart decadron to complete 10 days -consider toci after labs result would not give if appears pt may have superimposed bacterial infection  -LE doppler with CT scan broken to consider dvt with PE.  -limited echo  could be considered if CT scanner not up and running soon -she does have suspected RHS based on ekg with twave inversions on III and AVF as well as v1-6 although no st depression noted. (this is new from previous ekg on 9/27) -ddimer is elevated at 7.19 (higher than previous checks)  Shock:  Presumed 2/2 sepsis vs cardiogenic -titrate vasopressors to map >65 or SBP >90 -cont empiric abx.  -was on steroids at d/c will resume decadron  Lactic acidosis:  -trend for clearing  Sepsis 2/2 covid 19 pneumonia:  ? In addition a superimposed bacteria infection -cx pending -empiric abx -labs pending -pct is low despite wbc     Best  practice:  Diet: tf Pain/Anxiety/Delirium protocol (if indicated): yes VAP protocol (if indicated): yes DVT prophylaxis: per protocol, escalated dosing GI prophylaxis: famotidine Glucose control: per TRH Mobility: bedrest Code Status: FULL Family Communication: have attempted calling husband multiple times but no answer Disposition: ICU  Labs   CBC: Recent Labs  Lab 01/14/19 0243 01/15/19 0229 01/16/19 0220 01/17/19 0220 31-Jan-2019 0204  WBC 7.5 6.2 5.3 8.7 8.5  NEUTROABS 6.7 5.4 4.6 7.7 7.1  HGB 11.8* 11.9* 11.9* 12.4 9.7*  HCT 39.3 38.8 39.8 40.8 31.3*  MCV 96.3 96.3 96.4 96.0 96.3  PLT 381 350 341 369 321    Basic Metabolic Panel: Recent Labs  Lab 01/14/19 0243 01/15/19 0229 01/16/19 0220 01/17/19 0220 2019/01/31 0204  NA 135 136 137 137 141  K 4.1 4.0 4.6 4.8 4.4  CL 94* 95* 93* 94* 98  CO2 34* 34* 33* 35* 32  GLUCOSE 226* 241* 270* 193* 223*  BUN 21 28* 26* 28* 24*  CREATININE 0.34* 0.38* <0.30* 0.34* <0.30*  CALCIUM 8.2* 7.9* 7.8* 8.3* 6.9*   GFR: CrCl cannot be calculated (This lab value cannot be used to calculate CrCl because it is not a number: <0.30). Recent Labs  Lab 01/12/19 0953 01/12/19 2050  01/15/19 0229 01/16/19 0220 01/17/19 0220 Jan 31, 2019 0204  PROCALCITON  --  0.15  --   --   --   --  <0.10  WBC 13.7*  --    < > 6.2 5.3 8.7 8.5  LATICACIDVEN 2.7* 1.2  --   --   --   --  2.7*   < > = values in this interval not displayed.    Liver Function Tests: Recent Labs  Lab 01/14/19 0243 01/15/19 0229 01/16/19 0220 01/17/19 0220 January 31, 2019 0204  AST 19 16 14* 15 15  ALT ALKPHOS 61 55 62 54 42  BILITOT 0.2* 0.1* 0.3 0.3 0.6  PROT 5.6* 5.1* 5.2* 5.2* 3.9*  ALBUMIN 2.7* 2.5* 2.6* 2.6* 1.9*   No results for input(s): LIPASE, AMYLASE in the last 168 hours. No results for input(s): AMMONIA in the last 168 hours.  ABG    Component Value Date/Time   PHART 7.40 2019/01/31 0152   PCO2ART 55 (H) Jan 31, 2019 0152   PO2ART 249 (H)  31-Jan-2019 0152   HCO3 34.1 (H) 2019/01/31 0152   O2SAT 99.8 January 31, 2019 0152     Coagulation Profile: No results for input(s): INR, PROTIME in the last 168 hours.  Cardiac Enzymes: No results for input(s): CKTOTAL, CKMB, CKMBINDEX, TROPONINI in the last 168 hours.  HbA1C: Hemoglobin A1C  Date/Time Value Ref Range Status  01/20/2016 5.8%  Final  04-03-1946 6.1  Final   HB A1C (BAYER DCA - WAIVED)  Date/Time Value Ref Range Status  02/10/2017 09:36 AM 5.5 <  7.0 % Final    Comment:                                          Diabetic Adult            <7.0                                       Healthy Adult        4.3 - 5.7                                                           (DCCT/NGSP) American Diabetes Association's Summary of Glycemic Recommendations for Adults with Diabetes: Hemoglobin A1c <7.0%. More stringent glycemic goals (A1c <6.0%) may further reduce complications at the cost of increased risk of hypoglycemia.   07/20/2016 10:29 AM 5.8 <7.0 % Final    Comment:                                          Diabetic Adult            <7.0                                       Healthy Adult        4.3 - 5.7                                                           (DCCT/NGSP) American Diabetes Association's Summary of Glycemic Recommendations for Adults with Diabetes: Hemoglobin A1c <7.0%. More stringent glycemic goals (A1c <6.0%) may further reduce complications at the cost of increased risk of hypoglycemia.    Hgb A1c MFr Bld  Date/Time Value Ref Range Status  01/12/2019 10:50 PM 5.5 4.8 - 5.6 % Final    Comment:    (NOTE) Pre diabetes:          5.7%-6.4% Diabetes:              >6.4% Glycemic control for   <7.0% adults with diabetes     CBG: Recent Labs  Lab 01/16/19 1228 01/16/19 1604 01/16/19 2306 01/17/19 0735 01/17/19 1226  GLUCAP 207* 201* 124* 225* 227*    Review of Systems:   Unobtainable as pt is intubated and sedated  Past Medical History   She,  has a past medical history of COVID-19 (01/09/2019), Diabetes mellitus without complication (HCC), Gout, Hyperlipidemia, Hypertension, Insomnia, Shoulder pain, right, and Sleep apnea.   Surgical History    Past Surgical History:  Procedure Laterality Date  . ABDOMINAL HYSTERECTOMY       Social History   reports that she has never smoked. She has never used smokeless tobacco. She reports current alcohol use. She reports that she  does not use drugs.   Family History   Her family history includes Diabetes in her brother, brother, and maternal grandfather; Sleep apnea in her brother; Stroke in her mother.   Allergies No Known Allergies   Home Medications  Prior to Admission medications   Medication Sig Start Date End Date Taking? Authorizing Provider  acetaminophen (TYLENOL) 325 MG tablet Take 650 mg by mouth every 4 (four) hours as needed for mild pain.    [provider]  amLODipine (NORVASC) 5 MG tablet Take 1 tablet (5 mg total) by mouth daily. 10/30/17   Enid Baas, MD  atenolol (TENORMIN) 25 MG tablet Take 25 mg by mouth daily. 01/07/19   [provider]  Cranberry 450 MG CAPS Take 900 mg by mouth 2 (two) times daily.    [provider]  febuxostat (ULORIC) 40 MG tablet Take 40 mg by mouth daily. 01/09/19   [provider]  fluocinonide ointment (LIDEX) 0.05 % Apply 1 application topically as needed (dermatitis).  04/17/15   [provider]  HYDROcodone-acetaminophen (NORCO/VICODIN) 5-325 MG tablet Take 1 tablet by mouth every 6 (six) hours as needed for up to 5 days for moderate pain. 01/17/19 01/22/19  Azucena Fallen, MD  lisinopril (ZESTRIL) 10 MG tablet Take 10 mg by mouth daily. 12/24/18   [provider]  Melatonin-Pyridoxine (MELATIN PO) Take 1 tablet by mouth at bedtime.    [provider]  metFORMIN (GLUCOPHAGE) 500 MG tablet TAKE 1 TABLET BY MOUTH TWO  TIMES DAILY Patient taking differently: Take  500 mg by mouth 2 (two) times daily with a meal.  10/19/17   Gabriel Cirri, NP  OXYGEN Inhale 2 L into the lungs as needed (shortness of breath).    [provider]  polyethylene glycol (MIRALAX / GLYCOLAX) packet Take 17 g by mouth daily. 10/31/17   Enid Baas, MD  predniSONE (DELTASONE) 10 MG tablet Take 2 tablets (20 mg total) by mouth daily for 3 days, THEN 1 tablet (10 mg total) daily for 3 days. 01/17/19 01/23/19  Azucena Fallen, MD     Critical care time: The patient is critically ill with multiple organ systems failure and requires high complexity decision making for assessment and support, frequent evaluation and titration of therapies, application of advanced monitoring technologies and extensive interpretation of multiple databases.  Critical care time 43 mins. This represents my time independent of the NP's/PA's/med students/residents time taking care of the pt. This is excluding procedures.     Briant Sites DO Pager: [phone] After hours pager: [phone]  [facility] 2019/02/06, 12:19 PM

## 2019-01-18 NOTE — Progress Notes (Signed)
Assisted tele visit to patient with family members.  Priscilla Verne Ann, RN  

## 2019-01-18 NOTE — Progress Notes (Signed)
Initial Nutrition Assessment RD working remotely.  DOCUMENTATION CODES:   Not applicable  INTERVENTION:   Recommend begin TF via OGT:   Vital High Protein at 55 ml/h (1320 ml per day)   Provides 1320 kcal (1536 kcal total with fat kcal from propofol), 116 gm protein, 1104 ml free water daily  NUTRITION DIAGNOSIS:   Increased nutrient needs related to acute illness(COVID-19) as evidenced by estimated needs.  GOAL:   Patient will meet greater than or equal to 90% of their needs  MONITOR:   Vent status, Labs, I & O's, Skin  REASON FOR ASSESSMENT:   Ventilator    ASSESSMENT:   73 yo female just d/c from [facility] 9/29 (treated for COVID PNA), re-admitted 9/30 with hypotension and hypoxia to the 60's requiring intubation. PMH includes bed bound for >1 year, HTN, chronic respiratory failure on home oxygen, OSA, DM-2, CKD-3, HLD, obesity.   Patient is currently intubated on ventilator support, unresponsive. MV: 9.8 L/min Temp (24hrs), Avg:96.4 F (35.8 C), Min:94.2 F (34.6 C), Max:99.5 F (37.5 C)  Propofol at 8.2 ml/h providing 216 kcal from lipid per day  Labs reviewed. Glucose 236 (H) CBG's: none recorded   Medications reviewed and include decadron, vitamin C, zinc sulfate, levophed.  Per review of weight encounters, weight is down by 44% over the past year, significant weight loss for the time frame. From 2016 to 2019, weights ranged from 122-129 kg, last admission 72.1 kg, 68.4 kg.   NUTRITION - FOCUSED PHYSICAL EXAM:  deferred  Diet Order:   Diet Order            Diet NPO time specified  Diet effective now              EDUCATION NEEDS:   Not appropriate for education at this time  Skin:  Skin Assessment: Reviewed RN Assessment  Last BM:  no BM documented this admission  Height:   Ht Readings from Last 1 Encounters:  12/25/2018 5\' 5"  (1.651 m)    Weight:   Wt Readings from Last 1 Encounters:  01/06/2019 68.4 kg    Ideal Body Weight:  56.8  kg  BMI:  25.1  Estimated Nutritional Needs:   Kcal:  1500  Protein:  105-125 gm  Fluid:  > 1.5 L    Molli Barrows, RD, LDN, CNSC Pager [phone] After Hours Pager [phone]

## 2019-01-18 NOTE — Progress Notes (Signed)
Pt terminally extubated at 2055 as per MD order.  Pt resting comfortably post extubation.  RN and respiratory care at bedside.  No complications noted at time of extubation. Family on video chat at bedside.

## 2019-01-18 NOTE — ED Notes (Signed)
Called and spoke to [place] at [facility]. [facility] transport team will call for report when available

## 2019-01-18 NOTE — ED Notes (Signed)
Pt is awake and reaching for tube and monitors. Tech called to assist and sit with pt. Pt nods her head that she is warm enough. Holding our hands and following with her eyes. Reassurance given. Comfort provided

## 2019-01-18 NOTE — Progress Notes (Signed)
CDS called. Family still on video chat

## 2019-01-18 NOTE — ED Notes (Signed)
Lab called with positive blood culture results.  Dr. Cinda Quest aware.

## 2019-01-18 NOTE — Progress Notes (Signed)
Assisted tele visit to patient with grand daughter.  Priscilla Johnson, Priscilla Hoard, RN

## 2019-01-18 NOTE — ED Notes (Signed)
Dr Beather Arbour aware unable to draw 2nd lactic acid

## 2019-01-18 NOTE — ED Notes (Signed)
Urine output 10cc Dr Beather Arbour aware

## 2019-01-18 NOTE — Progress Notes (Signed)
Spoke with pt's daughter Maudie Mercury who lives in [place], [place] she is Therapist, sports and unable to come because her work would not let her come back to work with exposure of the visit until Audiological scientist. She is amendable to facetime with her mother.   I have relayed this to the charge nurse Nira Conn who will help facilitate transition to comfort as well as facetime.

## 2019-01-18 NOTE — Progress Notes (Signed)
Great conversation with pt's husband.   Pt has professed her faith and has been rapidly deteriorating for the past few weeks but steadily over > then 1 year. It has been over 1.5 years that the patient has gotten out of bed and walked.   At this time the pt's husband has stated that she would not want to be intubated and ceratinly not resuscitated.   I attempted to d/w him that she is already intubated and that perhaps a 24 to 48 hr period to see if she can improve at this time. However is he resolute in his decision and states that she and had just spoken 1 week ago with a physician and they were clear that she (nor he) would want to have heroic measures but pass with peace and dignity.   He is requesting that we honor those wishes and transition the patient to comfort care at this time.  Considering her co-morbid conditions and new shock with worsening resp failure req mechanical ventilation this is an appropriate decision.   We will transition to comfort care today.  I am attempted to reach the daughter Priscilla Johnson at [phone] to relay this information at the request of the husband.

## 2019-01-18 NOTE — ED Triage Notes (Signed)
Pt presents w/ respiratory distress r/t COVID. Pt from [facility] care. When EMS arrived at facility, pt's SaO2 60%. Pt discharged from [facility] after admission for [place]. Pt unresponsive and unable to answer questions or provide consent.

## 2019-01-18 NOTE — Sedation Documentation (Signed)
ET 7.5 mm, 22 cm @ lip, x 1 attempt by Dr. Beather Arbour. Positive color change and auscultation breath sounds bilaterally.

## 2019-01-18 NOTE — Progress Notes (Signed)
Assisted family with video chat where terminal extubation planned. Pt extubated at 2055. Camera turned away for extubation. 2mg  versed given post extubation due to making pt comfortable. Propofol gtt turned off.   Present at bedside is charge RN Lysbeth Galas, [place] and Respiratory.   Family still on video chat. All questions and concerns addressed. Pt is restless. 161mcg fentanyl given at 2102

## 2019-01-18 NOTE — Progress Notes (Signed)
Spoke with patients daughter Maudie Mercury who lives in [place], [place]. She is consistent with her father in that they wish for the patient to be extubated and made comfortable. Patient states there are several family members who want to see patient via video chat. Patient is 2.5 hours drive away from her father but they all want to be together for the video call. Tentative time for video call and extubation is around 8:00 pm. Daughter Maudie Mercury will call 30 mins before family is ready for nurse and RT to arrange extubation.

## 2019-01-18 NOTE — ED Provider Notes (Addendum)
[facility] Emergency Department Provider Note   ____________________________________________   First MD Initiated Contact with Patient 2019/01/29 0140     (approximate)  I have reviewed the triage vital signs and the nursing notes.   HISTORY  Chief Complaint Respiratory failure   HPI Priscilla Johnson is a 73 y.o. female brought to the ED via EMS from SNF with respiratory failure.  Patient was discharged yesterday from [facility] after a 5-day stay for COVID, pneumonia and hypoxia.  EMS reports room air saturations in the 60s upon their arrival.  Patient presents to the treatment room unresponsive.       Past Medical History:  Diagnosis Date   COVID-19 01/29/19   Diabetes mellitus without complication ([place])    Gout    Hyperlipidemia    Hypertension    Insomnia    Shoulder pain, right    Sleep apnea     Patient Active Problem List   Diagnosis Date Noted   Pneumonia due to COVID-19 virus 01/12/2019   Chronic respiratory failure with hypoxia ([place]) 01/12/2019   Pneumonia due to 2019-nCoV 01/12/2019   Toe fracture 10/26/2017   Anemia 07/21/2016   Advanced care planning/counseling discussion 07/20/2016   Gout 07/20/2016   Essential hypertension 08/23/2015   Severe obesity (BMI 35.0-35.9 with comorbidity) ([place]) 04/18/2015   Insomnia 01/16/2015   Sleep apnea 01/16/2015   Type II diabetes mellitus with renal manifestations ([place]) 01/16/2015   Hypertensive CKD (chronic kidney disease) 01/16/2015   Severe obesity ([place]) 01/16/2015   CKD (chronic kidney disease), stage III ([place]) 01/16/2015   Hyperlipidemia 01/16/2015    Past Surgical History:  Procedure Laterality Date   ABDOMINAL HYSTERECTOMY      Prior to Admission medications   Medication Sig Start Date End Date Taking? Authorizing Provider  acetaminophen (TYLENOL) 325 MG tablet Take 650 mg by mouth every 4 (four) hours as needed for mild pain.     [provider]  amLODipine (NORVASC) 5 MG tablet Take 1 tablet (5 mg total) by mouth daily. 10/30/17   Gladstone Lighter, MD  atenolol (TENORMIN) 25 MG tablet Take 25 mg by mouth daily. 01/07/19   [provider]  Cranberry 450 MG CAPS Take 900 mg by mouth 2 (two) times daily.    [provider]  febuxostat (ULORIC) 40 MG tablet Take 40 mg by mouth daily. 01/09/19   [provider]  fluocinonide ointment (LIDEX) 9.56 % Apply 1 application topically as needed (dermatitis).  04/17/15   [provider]  HYDROcodone-acetaminophen (NORCO/VICODIN) 5-325 MG tablet Take 1 tablet by mouth every 6 (six) hours as needed for up to 5 days for moderate pain. 01/17/19 01/22/19  Little Ishikawa, MD  lisinopril (ZESTRIL) 10 MG tablet Take 10 mg by mouth daily. 12/24/18   [provider]  Melatonin-Pyridoxine (MELATIN PO) Take 1 tablet by mouth at bedtime.    [provider]  metFORMIN (GLUCOPHAGE) 500 MG tablet TAKE 1 TABLET BY MOUTH TWO  TIMES DAILY Patient taking differently: Take 500 mg by mouth 2 (two) times daily with a meal.  10/19/17   Kathrine Haddock, NP  OXYGEN Inhale 2 L into the lungs as needed (shortness of breath).    [provider]  polyethylene glycol (MIRALAX / GLYCOLAX) packet Take 17 g by mouth daily. 10/31/17   Gladstone Lighter, MD  predniSONE (DELTASONE) 10 MG tablet Take 2 tablets (20 mg total) by mouth daily for 3 days, THEN 1 tablet (10 mg total)  daily for 3 days. 01/17/19 01/23/19  Azucena Fallen, MD    Allergies Patient has no known allergies.  Family History  Problem Relation Age of Onset   Stroke Mother    Diabetes Brother    Diabetes Maternal Grandfather    Diabetes Brother    Sleep apnea Brother     Social History Social History   Tobacco Use   Smoking status: Never Smoker   Smokeless tobacco: Never Used  Substance Use Topics   Alcohol use: Yes    Comment: on occasion   Drug use: No     Review of Systems  Constitutional: No fever/chills Eyes: No visual changes. ENT: No sore throat. Cardiovascular: Denies chest pain. Respiratory: Positive for shortness of breath. Gastrointestinal: No abdominal pain.  No nausea, no vomiting.  No diarrhea.  No constipation. Genitourinary: Negative for dysuria. Musculoskeletal: Negative for back pain. Skin: Negative for rash. Neurological: Negative for headaches, focal weakness or numbness.   ____________________________________________   PHYSICAL EXAM:  VITAL SIGNS: ED Triage Vitals [12-Feb-2019 0134]  Enc Vitals Group     BP (!) 112/55     Pulse Rate (!) 110     Resp      Temp      Temp src      SpO2 96 %     Weight      Height      Head Circumference      Peak Flow      Pain Score      Pain Loc      Pain Edu?      Excl. in GC?     Constitutional: Unresponsive. Eyes: Conjunctivae are normal. PERRL. EOMI. Head: Atraumatic. Nose: No congestion/rhinnorhea. Mouth/Throat: Mucous membranes are moist.  Oropharynx non-erythematous. Neck: No stridor.   Cardiovascular: Normal rate, regular rhythm. Grossly normal heart sounds.  Good peripheral circulation. Respiratory: Increased respiratory effort.   Gastrointestinal: Soft and nontender. No distention. No abdominal bruits. No CVA tenderness. Musculoskeletal: No lower extremity tenderness nor edema.  No joint effusions. Neurologic: Unresponsive.   Skin:  Skin is hot, dry and intact. No rash noted. Psychiatric: Unable to assess. ____________________________________________   LABS (all labs ordered are listed, but only abnormal results are displayed)  Labs Reviewed  SARS CORONAVIRUS 2 (HOSPITAL ORDER, PERFORMED IN [facility]) - Abnormal; Notable for the following components:      Result Value   SARS Coronavirus 2 POSITIVE (*)    All other components within normal limits  CBC WITH DIFFERENTIAL/PLATELET - Abnormal; Notable for the following components:    RBC 3.25 (*)    Hemoglobin 9.7 (*)    HCT 31.3 (*)    Abs Immature Granulocytes 0.13 (*)    All other components within normal limits  COMPREHENSIVE METABOLIC PANEL - Abnormal; Notable for the following components:   Glucose, Bld 223 (*)    BUN 24 (*)    Creatinine, Ser <0.30 (*)    Calcium 6.9 (*)    Total Protein 3.9 (*)    Albumin 1.9 (*)    All other components within normal limits  LACTIC ACID, PLASMA - Abnormal; Notable for the following components:   Lactic Acid, Venous 2.7 (*)    All other components within normal limits  URINALYSIS, COMPLETE (UACMP) WITH MICROSCOPIC - Abnormal; Notable for the following components:   Color, Urine YELLOW (*)    APPearance CLOUDY (*)    Glucose, UA 50 (*)    Protein, ur 30 (*)  Nitrite POSITIVE (*)    Leukocytes,Ua MODERATE (*)    WBC, UA >50 (*)    All other components within normal limits  BLOOD GAS, ARTERIAL - Abnormal; Notable for the following components:   pCO2 arterial 55 (*)    pO2, Arterial 249 (*)    Bicarbonate 34.1 (*)    Acid-Base Excess 7.5 (*)    All other components within normal limits  TROPONIN I (HIGH SENSITIVITY) - Abnormal; Notable for the following components:   Troponin I (High Sensitivity) 88 (*)    All other components within normal limits  TROPONIN I (HIGH SENSITIVITY) - Abnormal; Notable for the following components:   Troponin I (High Sensitivity) 91 (*)    All other components within normal limits  CULTURE, BLOOD (ROUTINE X 2)  CULTURE, BLOOD (ROUTINE X 2)  URINE CULTURE  PROCALCITONIN  LACTIC ACID, PLASMA   ____________________________________________  EKG  ED ECG REPORT I, Hulon Ferron J, the attending physician, personally viewed and interpreted this ECG.   Date: 12/06/2018  EKG Time: 0148  Rate: 87  Rhythm: normal EKG, normal sinus rhythm  Axis: Normal  Intervals:none  ST&T Change: T wave depression globally  ____________________________________________  RADIOLOGY  ED MD  interpretation: Consistent with COVID-19 pneumonia  Official radiology report(s): Dg Chest Port 1 View  Result Date: 10-25-18 CLINICAL DATA:  Respiratory distress. Recent diagnosis and hospitalization 4 COVID-19 pneumonia EXAM: PORTABLE CHEST 1 VIEW COMPARISON:  Radiograph 01/15/2019, CT 01/16/2019 FINDINGS: Endotracheal tube tip 2.7 cm from the carina. Enteric tube tip and side-port below the diaphragm in the stomach. Heterogeneous bilateral lung opacities consistent with COVID-19 pneumonia. Persistent but improved volume loss and retrocardiac opacities since prior exam. No pneumothorax. Small bilateral pleural effusions. IMPRESSION: 1. Endotracheal and enteric tubes in appropriate position. 2. Heterogeneous bilateral lung opacities consistent with COVID-19 pneumonia. 3. Persistent but improved volume loss at the left lung base and retrocardiac opacities since prior exam. 4. Small bilateral pleural effusions. Electronically Signed   By: Narda RutherfordMelanie  Sanford M.D.   On: 12/06/2018 02:15    ____________________________________________   PROCEDURES  Procedure(s) performed (including Critical Care):  Procedure Name: Intubation Date/Time: 10-25-18 1:48 AM Performed by: Irean HongSung, Zahara Rembert J, MD Pre-anesthesia Checklist: Patient identified, Patient being monitored, Emergency Drugs available, Timeout performed and Suction available Oxygen Delivery Method: Non-rebreather mask Preoxygenation: Pre-oxygenation with 100% oxygen Induction Type: Rapid sequence Ventilation: Mask ventilation without difficulty Laryngoscope Size: Glidescope and 3 Grade View: Grade I Tube size: 7.5 mm Number of attempts: 1 Airway Equipment and Method: Rigid stylet Placement Confirmation: ETT inserted through vocal cords under direct vision,  CO2 detector and Breath sounds checked- equal and bilateral      CRITICAL CARE Performed by: Irean HongSUNG,Mikolaj Woolstenhulme J   Total critical care time: 60 minutes  Critical care time was exclusive of  separately billable procedures and treating other patients.  Critical care was necessary to treat or prevent imminent or life-threatening deterioration.  Critical care was time spent personally by me on the following activities: development of treatment plan with patient and/or surrogate as well as nursing, discussions with consultants, evaluation of patient's response to treatment, examination of patient, obtaining history from patient or surrogate, ordering and performing treatments and interventions, ordering and review of laboratory studies, ordering and review of radiographic studies, pulse oximetry and re-evaluation of patient's condition.   ____________________________________________   INITIAL IMPRESSION / ASSESSMENT AND PLAN / ED COURSE  As part of my medical decision making, I reviewed the following data within the electronic MEDICAL RECORD NUMBER  Nursing notes reviewed and incorporated, Labs reviewed, EKG interpreted, Old chart reviewed, Radiograph reviewed, Discussed with admitting physician and Notes from prior ED visits     Priscilla Johnson was evaluated in Emergency Department on 12/21/2018 for the symptoms described in the history of present illness. She was evaluated in the context of the global COVID-19 pandemic, which necessitated consideration that the patient might be at risk for infection with the SARS-CoV-2 virus that causes COVID-19. Institutional protocols and algorithms that pertain to the evaluation of patients at risk for COVID-19 are in a state of rapid change based on information released by regulatory bodies including the CDC and federal and state organizations. These policies and algorithms were followed during the patient's care in the ED.    73 year old female who presents for respiratory failure. Differential includes, but is not limited to, viral syndrome, bronchitis including COPD exacerbation, pneumonia, reactive airway disease including asthma, CHF including  exacerbation with or without pulmonary/interstitial edema, pneumothorax, ACS, thoracic trauma, and pulmonary embolism.  Patient was discharged 1 day ago from [facility] for COVID, pneumonia and hypoxia.  She was unresponsive on arrival with oxygen saturations in the 60s.  Intubated immediately for airway protection.  Anticipate transfer back to [facility].   Clinical Course as of Jan 17 706  Wed Jan 18, 2019  0250 Unsuccessful attempt x2 at right central line; could not thread wire.  Blood pressure holding with IV fluids.   [JS]  0303 Broad-spectrum antibiotics ordered for UTI and HCAP.  Transfer center contacted for transfer.   [JS]  0309 Accepted by Dr. Camille Bal ([facility]).  Attempts to reach family unsuccessful.  Will continue to try.   [JS]  [id number] Left message for patient's husband to call back.  I also discussed case with [facility] intensivist Dr. Warrick Parisian; will administer dexamethasone.   [JS]  [id number] Patient had several blood pressures in the 80s systolic; Levophed was initiated.  BP currently 101/62.   [JS]  0655 BP 93/60.  I am told [facility] will arrive for transport sometime after 7 AM.  No family has shown up nor returned phone messages.   [JS]    Clinical Course User Index [JS] Irean Hong, MD     ____________________________________________   FINAL CLINICAL IMPRESSION(S) / ED DIAGNOSES  Final diagnoses:  Acute on chronic respiratory failure with hypoxia (HCC)  Urinary tract infection without hematuria, site unspecified  HCAP (healthcare-associated pneumonia)  Hypotension, unspecified hypotension type  Sepsis, due to unspecified organism, unspecified whether acute organ dysfunction present (HCC)  Hypothermia, initial encounter     ED Discharge Orders    None       Note:  This document was prepared using Dragon voice recognition software and may include unintentional dictation errors.   Irean Hong, MD 12/25/2018 0981    Irean Hong, MD 01/03/2019 [phone]

## 2019-01-19 DEATH — deceased

## 2019-01-20 LAB — CULTURE, BLOOD (ROUTINE X 2)

## 2019-01-23 LAB — URINE CULTURE: Culture: >=100000 — AB

## 2019-02-19 NOTE — Progress Notes (Signed)
Patient resting in bed with no signs or symptoms of distress.

## 2019-02-19 NOTE — Death Summary Note (Signed)
Death Summary  Priscilla Johnson WLS:[id number] DOB: June 12, 1945 DOA: February 13, 2019  PCP: Priscilla Shivers, MD PCP/Office notified: no   Admit date: Feb 13, 2019 Date of Death: 2019/02/14  Final Diagnoses:  Active Problems:   Sleep apnea   CKD (chronic kidney disease), stage III (HCC)   Hyperlipidemia   Severe obesity (BMI 35.0-35.9 with comorbidity) ([place])   Essential hypertension   Pneumonia due to COVID-19 virus   Chronic respiratory failure with hypoxia (HCC)   Diabetes mellitus type 2, controlled, with complications ([place])   Acute on chronic respiratory failure with hypoxia/COVID 19 pneumonia - Restart Decadron 6 mg daily - Patient completed course of Remdesivir 9/29 prior to discharge - Considering Actemra given patient's relapse within 24-hour.  However unable to contact patient's husband at this time for consent.  Unsure that patient would benefit, however this is probably the last treatment available to patient.  Given patient's poor overall living conditions not a candidate for COVID convalescent plasma. Last Labs          Recent Labs  Lab 01/14/19 0243 01/15/19 0229 01/16/19 0220 01/17/19 0220 02/13/2019 1140  CRP 2.6* 1.1* 0.8 <0.8 2.0*     Last Labs       Recent Labs  Lab 01/12/19 2050 February 13, 2019 1140  DDIMER 0.52* 7.19*     HCAP/Bacterial pneumonia? -Obtain sputum culture -We will hold on starting antibiotics, with a procalcitonin 0.10 -Ipratropium QID  PE? - Patient's d-dimer increased. - We will start patient on full dose Lovenox until we can obtain CTA PE protocol  Essential HTN/Hypotension - Currently hypotensive patient received 3 L normal saline at [facility] - Currently on Levophed.  Titrate down as tolerated maintain MAP> 65 - Patient also malnourished albumin 2.4.  Administer albumin 50 g  CKD stage III (baseline Cr 1.04, last obtained 10/30/2017) Last Labs   Recent Labs  Lab 01/15/19 0229 01/16/19 0220 01/17/19 0220  February 13, 2019 0204 13-Feb-2019 1140  CREATININE 0.38* <0.30* 0.34* <0.30* 0.37*      Elevated troponin/demand ischemia - TroponinI HS: 88/91/56 -Slight bump most likely secondary to demand ischemia, secondary to hypotension appears to be trending down. - Abnormal EKG when compared to previous hospitalization no significant change  HLD  Severe obesity  Goals of care -13-Feb-2023 per Dr. Audria Johnson note: Priscilla Johnson conversation with pt's husband.   Pt has professed her faith and has been rapidly deteriorating for the past few weeks but steadily over > then 1 year. It has been over 1.5 years that the patient has gotten out of bed and walked.   At this time the pt's husband has stated that she would not want to be intubated and ceratinly not resuscitated.   I attempted to d/w him that she is already intubated and that perhaps a 24 to 48 hr period to see if she can improve at this time. However is he resolute in his decision and states that she and had just spoken 1 week ago with a physician and they were clear that she (nor he) would want to have heroic measures but pass with peace and dignity.   He is requesting that we honor those wishes and transition the patient to comfort care at this time.  Considering her co-morbid conditions and new shock with worsening resp failure req mechanical ventilation this is an appropriate decision.   We will transition to comfort care today.       History of present illness:  HPI: Priscilla Johnson is a 73 y.o. WF PMHx  essential HTN, chronic respiratory failure with hypoxia on 3 L O2 at home, OSA, diabetes type 2 controlled with complication, CKD stage III, HLD, severe obesity, anxiety  [facility]charged on 9/29 from [facility] for COVID pneumonia Patient admitted to [facility] for acute on chronic hypoxic respiratory failure in the setting of COVID-19 pneumonia. Patient symptoms have now resolved, patient has completed Remdesivir course, continue steroids. Patient is  bedbound per report, she indicates she has not walked in over a year. Patient had CT chest given ongoing episodes of tachycardia and tachypnea although without hypoxia. CT does show some atelectasis and likely debris in left bronchus concerning for aspiration, speech evaluated, patient placed on dysphagia 2 diet with chopped foods and thin liquids. Concerninghistory of anxiety given whenever discussion of disposition back to [facility] nursing facility as outpatient becomes quite overtly anxious, diaphoretic, tachypneic, tachycardic. Unclear anxiety history per patient or family however patient has improved quite well during her events on very low-dose 0.5 mg of Ativan. Certainly would not continue this in the outpatient setting given her advanced age but patient would benefit from evaluation for long-term anxiety medications that are not benzodiazepines. We discussed at length with patient and family if she is truly unhappy at her facility she should be able to change facilities if 1 can be available given her COVID-19 pneumonia diagnosis recently. At this time patient is stable for discharge back to SNF, defer to case management for discharge back to [facility] nursing facility versus other facility per their discussion.  Hospital Course:  See goals of care   Time: 64 Signed:  Carolyne Littles, MD [facility] [phone]

## 2019-02-19 NOTE — Progress Notes (Signed)
Notified by RN Megan Salon that patient has expired as of 0150. Post Mortem checklist began. Family notified. MD notified. CDS notified. No pt belongings at bedside.

## 2019-02-19 NOTE — Progress Notes (Signed)
Patient transferred to the morgue 

## 2019-02-19 DEATH — deceased

## 2020-09-15 IMAGING — CT CT ANGIO CHEST
1 of 4 series · 12 of 30 positions shown · IV contrast (OMNIPAQUE)
Comparison: Chest x-ray 01/15/2019

CLINICAL DATA: Positive D-dimer

EXAM:
CT ANGIOGRAPHY CHEST WITH CONTRAST
TECHNIQUE: Multidetector CT imaging of the chest was performed using the
standard protocol during bolus administration of intravenous
contrast. Multiplanar CT image reconstructions and MIPs were
obtained to evaluate the vascular anatomy.
CONTRAST:  75mL OMNIPAQUE IOHEXOL 350 MG/ML SOLN

[Series 4: pe chest · axial · 0.87mm/px · z∈[-230,+20]mm · 12 of 151 slices shown]
[im 13/151  lung]
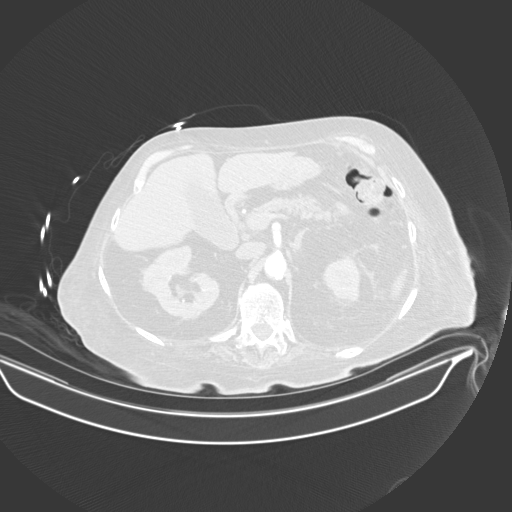
[im 26/151  mediastinal]
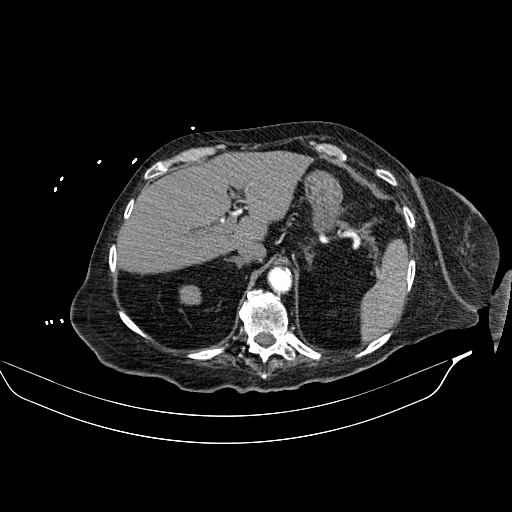
[im 38/151  lung]
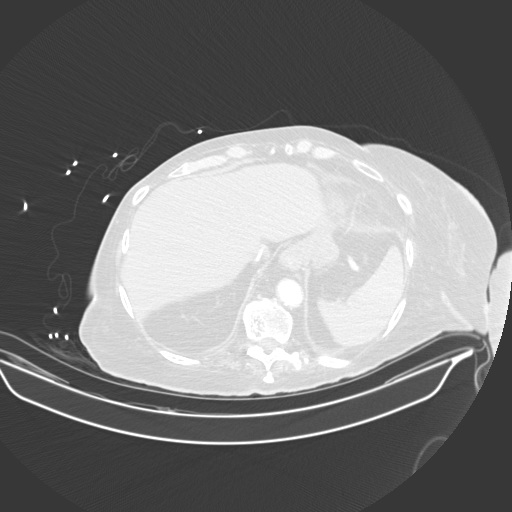
[im 51/151  mediastinal]
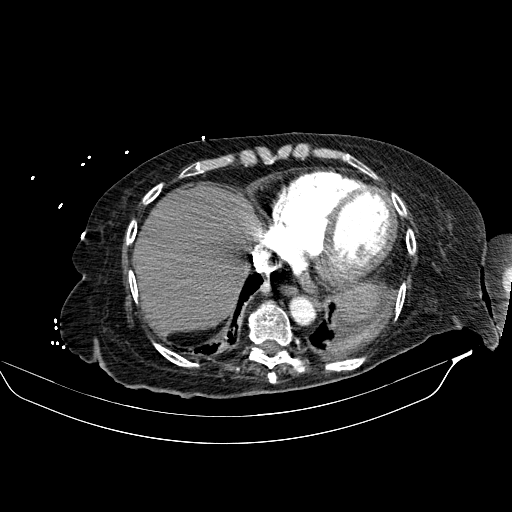
[im 63/151  lung]
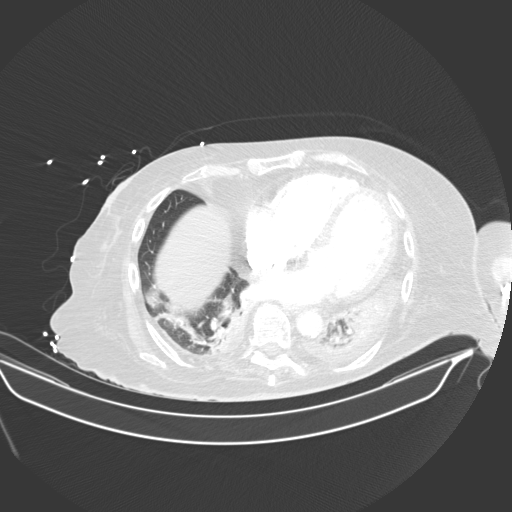
[im 72/151  mediastinal]
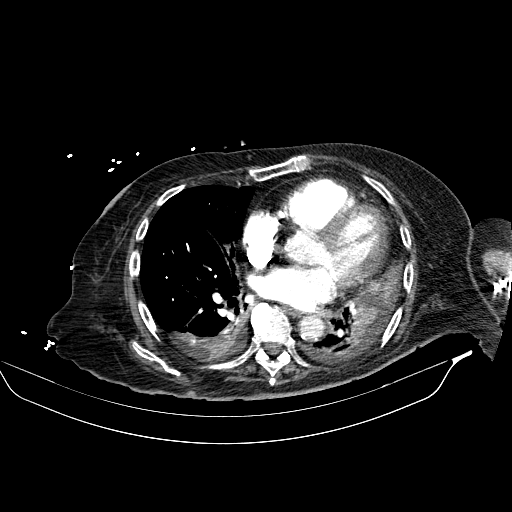
[im 76/151  lung]
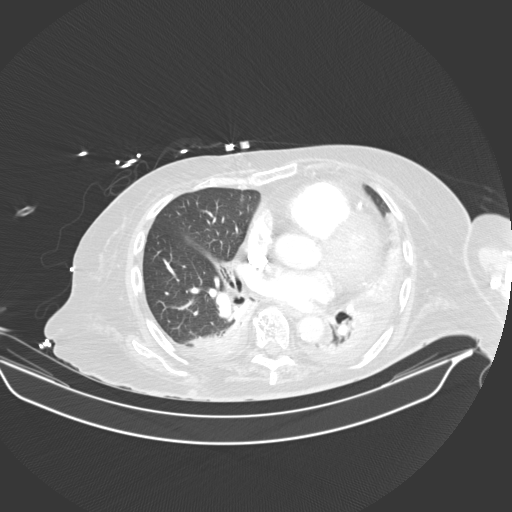
[im 88/151  mediastinal]
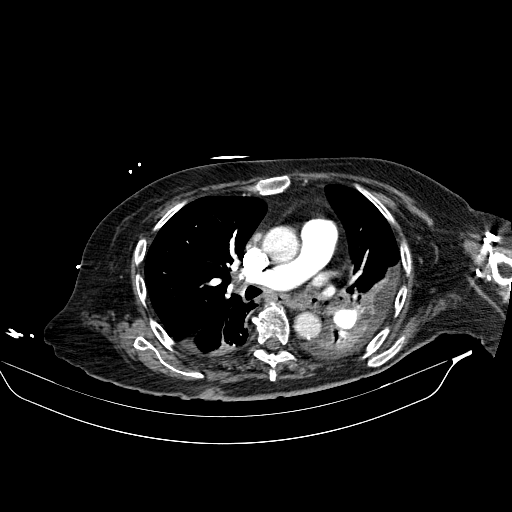
[im 101/151  lung]
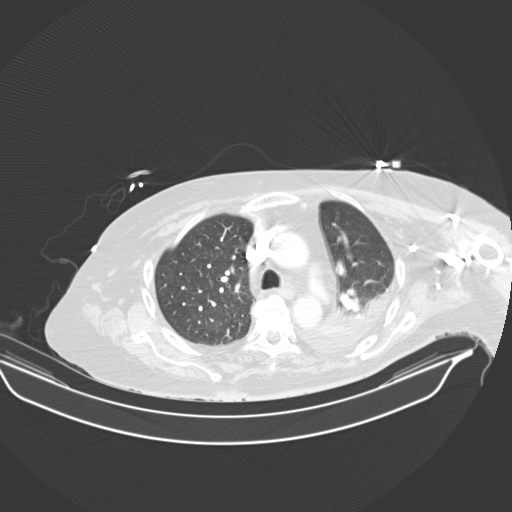
[im 113/151  mediastinal]
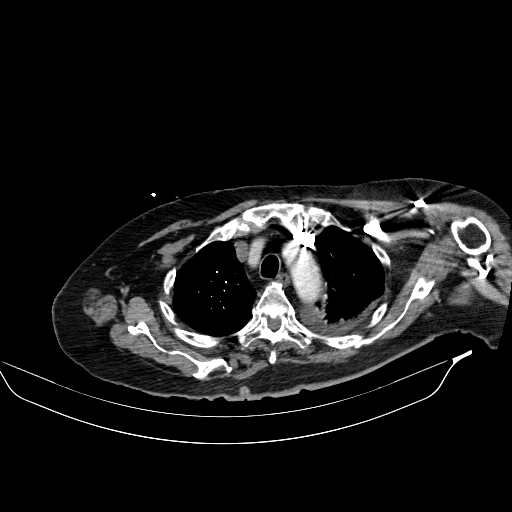
[im 126/151  lung]
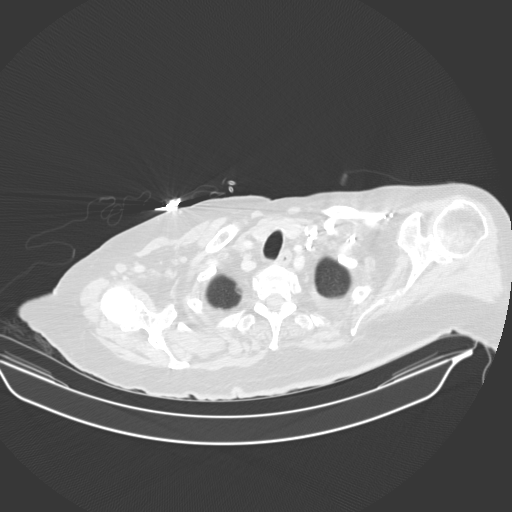
[im 138/151  mediastinal]
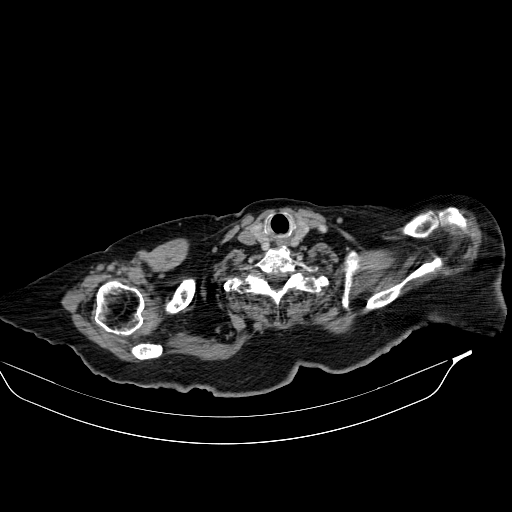

[12 of 30 positions shown; findings below may reference images not displayed]

FINDINGS: Cardiovascular: No filling defects in the pulmonary arteries to
suggest pulmonary emboli. Aorta is normal caliber. Extensive
coronary artery calcifications. Scattered aortic calcifications in
the descending thoracic aorta. No aneurysm.

Mediastinum/Nodes: No mediastinal, hilar, or axillary adenopathy.

Lungs/Pleura: Small bilateral pleural effusions, left greater than
right. Airspace disease in both lower lobes, likely atelectasis in
the right lung base. Airspace disease in the left base could reflect
atelectasis or pneumonia. There is mucus/debris noted in the left
mainstem bronchus with near complete obstruction of the left
mainstem bronchus.

Upper Abdomen: Imaging into the upper abdomen shows no acute
findings.

Musculoskeletal: Chest wall soft tissues are unremarkable. No acute
bony abnormality.

Review of the MIP images confirms the above findings.
IMPRESSION: Mucus/debris noted within the left mainstem bronchus with near
complete obstruction. Airspace disease in the left lower lobe could
reflect atelectasis or pneumonia.

Small bilateral pleural effusions.  Right base atelectasis.

Cardiomegaly, coronary artery disease.

Aortic Atherosclerosis ([id number]).
